# Patient Record
Sex: Female | Born: 1960 | Race: Black or African American | Hispanic: No | Marital: Married | Smoking: Current every day smoker
Health system: Southern US, Community
[De-identification: ages and names within clinical notes are randomized; demographics above are authoritative.]

## PROBLEM LIST (undated history)

## (undated) DIAGNOSIS — I10 Essential (primary) hypertension: Secondary | ICD-10-CM

## (undated) DIAGNOSIS — E119 Type 2 diabetes mellitus without complications: Secondary | ICD-10-CM

## (undated) DIAGNOSIS — G629 Polyneuropathy, unspecified: Secondary | ICD-10-CM

---

## 2016-10-28 ENCOUNTER — Emergency Department
Admission: EM | Admit: 2016-10-28 | Discharge: 2016-10-28 | Disposition: A | Discharge status: Home / Self Care | Payer: Self-pay | Attending: Emergency Medicine | Admitting: Emergency Medicine

## 2016-10-28 ENCOUNTER — Encounter: Payer: Self-pay | Admitting: *Deleted

## 2016-10-28 DIAGNOSIS — I10 Essential (primary) hypertension: Secondary | ICD-10-CM | POA: Insufficient documentation

## 2016-10-28 DIAGNOSIS — Z760 Encounter for issue of repeat prescription: Secondary | ICD-10-CM | POA: Insufficient documentation

## 2016-10-28 DIAGNOSIS — E119 Type 2 diabetes mellitus without complications: Secondary | ICD-10-CM | POA: Insufficient documentation

## 2016-10-28 DIAGNOSIS — Z79899 Other long term (current) drug therapy: Secondary | ICD-10-CM | POA: Insufficient documentation

## 2016-10-28 HISTORY — DX: Type 2 diabetes mellitus without complications: E11.9

## 2016-10-28 HISTORY — DX: Essential (primary) hypertension: I10

## 2016-10-28 MED ORDER — PANTOPRAZOLE SODIUM 20 MG PO TBEC: 20.0000 mg | DELAYED_RELEASE_TABLET | Freq: Every day | ORAL | 0 refills | Status: AC

## 2016-10-28 MED ORDER — POTASSIUM CHLORIDE ER 10 MEQ PO TBCR: 10.0000 meq | EXTENDED_RELEASE_TABLET | Freq: Every day | ORAL | 0 refills | Status: DC

## 2016-10-28 MED ORDER — AMLODIPINE BESYLATE 5 MG PO TABS: 5.0000 mg | ORAL_TABLET | Freq: Every day | ORAL | 0 refills | Status: DC

## 2016-10-28 MED ORDER — TRIAMTERENE-HCTZ 37.5-25 MG PO TABS: 1.0000 | ORAL_TABLET | Freq: Every day | ORAL | 0 refills | Status: AC

## 2016-10-28 NOTE — ED Triage Notes (Signed)
States she is from [place] and is out of her medications (k-dur, HTN meds, and DM meds)

## 2016-10-28 NOTE — ED Provider Notes (Signed)
[facility] Emergency Department Provider Note   ____________________________________________   First MD Initiated Contact with Patient 10/28/16 1025     (approximate)  I have reviewed the triage vital signs and the nursing notes.   HISTORY  Chief Complaint Medication Refill    HPI Molly Daniels is a 55 y.o. female patient requests a refill of medications for her hypertension and reflux. Patient states she recently arrived from [place] and is relocating to this area. Patient asked to have his Primary doctor. Patient denies any pain with her complaint.   Past Medical History:  Diagnosis Date  . Diabetes mellitus without complication (HCC)   . Hypertension     There are no active problems to display for this patient.   History reviewed. No pertinent surgical history.  Prior to Admission medications   Medication Sig Start Date End Date Taking? Authorizing Provider  amLODipine (NORVASC) 5 MG tablet Take 1 tablet (5 mg total) by mouth daily. 10/28/16 10/28/17  Joni Reiningonald K Michell Kader, PA-C  pantoprazole (PROTONIX) 20 MG tablet Take 1 tablet (20 mg total) by mouth daily. 10/28/16   Joni Reiningonald K Inis Borneman, PA-C  potassium chloride (K-DUR) 10 MEQ tablet Take 1 tablet (10 mEq total) by mouth daily. 10/28/16   Joni Reiningonald K Natilie Krabbenhoft, PA-C  triamterene-hydrochlorothiazide (MAXZIDE-25) 37.5-25 MG tablet Take 1 tablet by mouth daily. 10/28/16 10/28/17  Joni Reiningonald K Josef Tourigny, PA-C    Allergies   History reviewed. No pertinent family history.  Social History Social History  Substance Use Topics  . Smoking status: Not on file  . Smokeless tobacco: Not on file  . Alcohol use Not on file    Review of Systems Constitutional: No fever/chills Eyes: No visual changes. ENT: No sore throat. Cardiovascular: Denies chest pain. Respiratory: Denies shortness of breath. Gastrointestinal: No abdominal pain.  No nausea, no vomiting.  No diarrhea.  No constipation. Genitourinary: Negative  for dysuria. Musculoskeletal: Negative for back pain. Skin: Negative for rash. Neurological: Negative for headaches, focal weakness or numbness. Endocrine:Hypertension and diabetes ____________________________________________   PHYSICAL EXAM:  VITAL SIGNS: ED Triage Vitals  Enc Vitals Group     BP 10/28/16 0947 127/88     Pulse Rate 10/28/16 0947 90     Resp 10/28/16 0947 18     Temp 10/28/16 0947 98.1 F (36.7 C)     Temp Source 10/28/16 0947 Oral     SpO2 10/28/16 0947 98 %     Weight 10/28/16 0946 175 lb (79.4 kg)     Height 10/28/16 0946 5\' 4"  (1.626 m)     Head Circumference --      Peak Flow --      Pain Score --      Pain Loc --      Pain Edu? --      Excl. in GC? --     Constitutional: Alert and oriented. Well appearing and in no acute distress. Eyes: Conjunctivae are normal. PERRL. EOMI. Head: Atraumatic. Nose: No congestion/rhinnorhea. Mouth/Throat: Mucous membranes are moist.  Oropharynx non-erythematous. Neck: No stridor.  No cervical spine tenderness to palpation. Hematological/Lymphatic/Immunilogical: No cervical lymphadenopathy. Cardiovascular: Normal rate, regular rhythm. Grossly normal heart sounds.  Good peripheral circulation. Respiratory: Normal respiratory effort.  No retractions. Lungs CTAB. Gastrointestinal: Soft and nontender. No distention. No abdominal bruits. No CVA tenderness. Musculoskeletal: No lower extremity tenderness nor edema.  No joint effusions. Neurologic:  Normal speech and language. No gross focal neurologic deficits are appreciated. No gait instability. Skin:  Skin is  warm, dry and intact. No rash noted. Psychiatric: Mood and affect are normal. Speech and behavior are normal.  ____________________________________________   LABS (all labs ordered are listed, but only abnormal results are displayed)  Labs Reviewed - No data to  display ____________________________________________  EKG   ____________________________________________  RADIOLOGY   ____________________________________________   PROCEDURES  Procedure(s) performed: None  Procedures  Critical Care performed: No  ____________________________________________   INITIAL IMPRESSION / ASSESSMENT AND PLAN / ED COURSE  Pertinent labs & imaging results that were available during my care of the patient were reviewed by me and considered in my medical decision making (see chart for details).  Medication refill for hypertension and GERD. Patient advised to [facility] Patient given a prescription for Maxide, Protonix, and Norvasc.  Clinical Course      ____________________________________________   FINAL CLINICAL IMPRESSION(S) / ED DIAGNOSES  Final diagnoses:  Medication refill      NEW MEDICATIONS STARTED DURING THIS VISIT:  New Prescriptions   AMLODIPINE (NORVASC) 5 MG TABLET    Take 1 tablet (5 mg total) by mouth daily.   PANTOPRAZOLE (PROTONIX) 20 MG TABLET    Take 1 tablet (20 mg total) by mouth daily.   POTASSIUM CHLORIDE (K-DUR) 10 MEQ TABLET    Take 1 tablet (10 mEq total) by mouth daily.   TRIAMTERENE-HYDROCHLOROTHIAZIDE (MAXZIDE-25) 37.5-25 MG TABLET    Take 1 tablet by mouth daily.     Note:  This document was prepared using Dragon voice recognition software and may include unintentional dictation errors.    Joni ReiningRonald K Antwane Grose, PA-C 10/28/16 1028    Governor Rooksebecca Lord, MD 10/28/16 [phone]

## 2016-10-28 NOTE — ED Notes (Addendum)
Pt here for med refill only. States [place] and ran out of meds. Pt denies chest pain, denies sob, denies headache, denies all symptoms.

## 2016-11-12 ENCOUNTER — Encounter: Payer: Self-pay | Admitting: *Deleted

## 2016-11-12 ENCOUNTER — Emergency Department
Admission: EM | Admit: 2016-11-12 | Discharge: 2016-11-12 | Disposition: A | Discharge status: Home / Self Care | Payer: Medicare Other | Attending: Emergency Medicine | Admitting: Emergency Medicine

## 2016-11-12 DIAGNOSIS — B379 Candidiasis, unspecified: Secondary | ICD-10-CM | POA: Diagnosis not present

## 2016-11-12 DIAGNOSIS — I10 Essential (primary) hypertension: Secondary | ICD-10-CM | POA: Diagnosis not present

## 2016-11-12 DIAGNOSIS — L298 Other pruritus: Secondary | ICD-10-CM | POA: Diagnosis present

## 2016-11-12 DIAGNOSIS — E119 Type 2 diabetes mellitus without complications: Secondary | ICD-10-CM | POA: Insufficient documentation

## 2016-11-12 DIAGNOSIS — Z79899 Other long term (current) drug therapy: Secondary | ICD-10-CM | POA: Diagnosis not present

## 2016-11-12 MED ORDER — FLUCONAZOLE 150 MG PO TABS: 150.0000 mg | ORAL_TABLET | Freq: Once | ORAL | 0 refills | Status: AC

## 2016-11-12 NOTE — ED Triage Notes (Signed)
States vaginal lacerations that are so "so deep that they hurt during intercourse", when asked how the lacerations came about pt states "We have been separated for 5 months and I will just leave it at that", pt asking for some type of vaginal cream to help with the lacerations, pt also states sore throat, states abrasion on her left rib cage from her bra strap, husband with pt

## 2016-11-12 NOTE — ED Notes (Signed)
Pt verbalized understanding of d/c instructions, medications and f/u.

## 2016-11-12 NOTE — ED Provider Notes (Signed)
[facility] Emergency Department Provider Note   ____________________________________________    I have reviewed the triage vital signs and the nursing notes.   HISTORY  Chief Complaint vaginal abrasions     HPI Molly Daniels is a 56 y.o. female who presents with complaints of vaginal burning. Patient reports she had intercourse with her significant other several days ago and since then has had burning sensation in her vagina, occasional itching. No other complaints, no dysuria.   Past Medical History:  Diagnosis Date  . Diabetes mellitus without complication (HCC)   . Hypertension     There are no active problems to display for this patient.   History reviewed. No pertinent surgical history.  Prior to Admission medications   Medication Sig Start Date End Date Taking? Authorizing Provider  amLODipine (NORVASC) 5 MG tablet Take 1 tablet (5 mg total) by mouth daily. 10/28/16 10/28/17  Joni Reining, PA-C  fluconazole (DIFLUCAN) 150 MG tablet Take 1 tablet (150 mg total) by mouth once. 11/12/16 11/12/16  Jene Every, MD  pantoprazole (PROTONIX) 20 MG tablet Take 1 tablet (20 mg total) by mouth daily. 10/28/16   Joni Reining, PA-C  potassium chloride (K-DUR) 10 MEQ tablet Take 1 tablet (10 mEq total) by mouth daily. 10/28/16   Joni Reining, PA-C  triamterene-hydrochlorothiazide (MAXZIDE-25) 37.5-25 MG tablet Take 1 tablet by mouth daily. 10/28/16 10/28/17  Joni Reining, PA-C     Allergies Abilify [aripiprazole] and Other  History reviewed. No pertinent family history.  Social History Social History  Substance Use Topics  . Smoking status: Not on file  . Smokeless tobacco: Not on file  . Alcohol use Not on file    Review of Systems  Constitutional: No fever/chills  ENT: No sore throat.   Gastrointestinal: No abdominal pain.  No nausea, no vomiting.   Genitourinary: Negative for dysuria.As above  Skin: Negative for  rash.     ____________________________________________   PHYSICAL EXAM:  VITAL SIGNS: ED Triage Vitals [11/12/16 1330]  Enc Vitals Group     BP 139/86     Pulse Rate 79     Resp 18     Temp 98.1 F (36.7 C)     Temp Source Oral     SpO2 100 %     Weight 175 lb (79.4 kg)     Height 5\' 4"  (1.626 m)     Head Circumference      Peak Flow      Pain Score 0     Pain Loc      Pain Edu?      Excl. in GC?     Constitutional: Alert and oriented. No acute distress.  Eyes: Conjunctivae are normal.  Head: Atraumatic. Nose: No congestion/rhinnorhea. Mouth/Throat: Mucous membranes are moist.   Cardiovascular: Normal rate, regular rhythm.  Respiratory: Normal respiratory effort.  No retractions. Genitourinary: Exam consistent with yeast infection Musculoskeletal: No lower extremity tenderness nor edema.   Neurologic:  Normal speech and language.  Skin:  Skin is warm, dry and intact. No rash noted.   ____________________________________________   LABS (all labs ordered are listed, but only abnormal results are displayed)  Labs Reviewed - No data to display ____________________________________________  EKG   ____________________________________________  RADIOLOGY  None ____________________________________________   PROCEDURES  Procedure(s) performed: No    Critical Care performed: No ____________________________________________   INITIAL IMPRESSION / ASSESSMENT AND PLAN / ED COURSE  Pertinent labs & imaging results that  were available during my care of the patient were reviewed by me and considered in my medical decision making (see chart for details).  Exam consistent with yeast infection, likely Candida we'll treat with fluconazole outpatient follow-up as needed   ____________________________________________   FINAL CLINICAL IMPRESSION(S) / ED DIAGNOSES  Final diagnoses:  Yeast infection      NEW MEDICATIONS STARTED DURING THIS  VISIT:  Discharge Medication List as of 11/12/2016  3:09 PM    START taking these medications   Details  fluconazole (DIFLUCAN) 150 MG tablet Take 1 tablet (150 mg total) by mouth once., Starting Wed 11/12/2016, Print         Note:  This document was prepared using Dragon voice recognition software and may include unintentional dictation errors.    Jene Everyobert Johnryan Sao, MD 11/12/16 2227

## 2016-11-12 NOTE — ED Notes (Signed)
Pt requesting that significant other be informed of findings. Pt reports she feels safe at home and with significant other, denies needing any assistance.

## 2016-11-12 NOTE — ED Notes (Signed)
Pt reports painful to have sex and is concerned of an infection or laceration. MD in room to assess pt. Pt significant other speaking for pt and asked to step out of the room for examination.

## 2016-11-19 ENCOUNTER — Ambulatory Visit: Payer: Medicare Other

## 2016-11-29 ENCOUNTER — Emergency Department
Admission: EM | Admit: 2016-11-29 | Discharge: 2016-11-29 | Disposition: A | Discharge status: Home / Self Care | Payer: Medicare Other | Attending: Emergency Medicine | Admitting: Emergency Medicine

## 2016-11-29 ENCOUNTER — Encounter: Payer: Self-pay | Admitting: Emergency Medicine

## 2016-11-29 DIAGNOSIS — I10 Essential (primary) hypertension: Secondary | ICD-10-CM | POA: Diagnosis not present

## 2016-11-29 DIAGNOSIS — R350 Frequency of micturition: Secondary | ICD-10-CM | POA: Insufficient documentation

## 2016-11-29 DIAGNOSIS — E119 Type 2 diabetes mellitus without complications: Secondary | ICD-10-CM | POA: Diagnosis not present

## 2016-11-29 DIAGNOSIS — Z760 Encounter for issue of repeat prescription: Secondary | ICD-10-CM | POA: Diagnosis not present

## 2016-11-29 DIAGNOSIS — F17200 Nicotine dependence, unspecified, uncomplicated: Secondary | ICD-10-CM | POA: Insufficient documentation

## 2016-11-29 DIAGNOSIS — Z79899 Other long term (current) drug therapy: Secondary | ICD-10-CM | POA: Insufficient documentation

## 2016-11-29 HISTORY — DX: Polyneuropathy, unspecified: G62.9

## 2016-11-29 LAB — URINALYSIS, COMPLETE (UACMP) WITH MICROSCOPIC
BACTERIA UA: NONE SEEN
BILIRUBIN URINE: NEGATIVE
Glucose, UA: NEGATIVE mg/dL
HGB URINE DIPSTICK: NEGATIVE
KETONES UR: NEGATIVE mg/dL
LEUKOCYTES UA: NEGATIVE
NITRITE: NEGATIVE
PH: 5 (ref 5.0–8.0)
Protein, ur: NEGATIVE mg/dL
Specific Gravity, Urine: 1.012 (ref 1.005–1.030)

## 2016-11-29 MED ORDER — PHENAZOPYRIDINE HCL 200 MG PO TABS: 200.0000 mg | ORAL_TABLET | Freq: Three times a day (TID) | ORAL | 0 refills | Status: AC | PRN

## 2016-11-29 MED ORDER — PHENAZOPYRIDINE HCL 200 MG PO TABS
200.0000 mg | ORAL_TABLET | Freq: Once | ORAL | Status: AC
  Administered 2016-11-29: 200 mg via ORAL
  Filled 2016-11-29: qty 1

## 2016-11-29 MED ORDER — POTASSIUM CHLORIDE ER 10 MEQ PO TBCR: 10.0000 meq | EXTENDED_RELEASE_TABLET | Freq: Every day | ORAL | 1 refills | Status: DC

## 2016-11-29 MED ORDER — TRIAMTERENE-HCTZ 37.5-25 MG PO TABS: 1.0000 | ORAL_TABLET | Freq: Every day | ORAL | 1 refills | Status: AC

## 2016-11-29 MED ORDER — AMLODIPINE BESYLATE 5 MG PO TABS: 5.0000 mg | ORAL_TABLET | Freq: Every day | ORAL | 1 refills | Status: DC

## 2016-11-29 NOTE — Discharge Instructions (Signed)
Your medication refill for 1 month. Before they run out he must establish care with family doctor.

## 2016-11-29 NOTE — ED Notes (Signed)
Ron PA at bedside. Pt's initial complaint is urinary frequency. Pt also states she fell 1 week ago and is out of her medications.

## 2016-11-29 NOTE — ED Triage Notes (Signed)
Pt in via POV with complaints of urinary frequency and urgency x approximately one week.  Pt denies any dysuria, denies any abdominal pain, denies N/VD.  NAD noted at this time.

## 2016-11-29 NOTE — ED Provider Notes (Signed)
[facility] Emergency Department Provider Note   ____________________________________________   First MD Initiated Contact with Patient 11/29/16 1547     (approximate)  I have reviewed the triage vital signs and the nursing notes.   HISTORY  Chief Complaint Urinary Frequency    HPI Molly Daniels is a 56 y.o. female patient presented to the emergency room for urinary frequency and urgency for 1 week. Patient denies any dysuria. Patient denies vaginal discharge, pelvic pain flank pain or fever. Patient also requests refill her medications. Patient was seen here last year for same complaint and had her medications filled but did not establish care with family doctor. Patient denies any pain in her urgency or frequency. Patient also complaining of knee pain status post a fall one week ago. Patient states she is able to bear weight without discomfort.   Past Medical History:  Diagnosis Date  . Diabetes mellitus without complication (HCC)   . Hypertension   . Neuropathy (HCC)     There are no active problems to display for this patient.   History reviewed. No pertinent surgical history.  Prior to Admission medications   Medication Sig Start Date End Date Taking? Authorizing Provider  amLODipine (NORVASC) 5 MG tablet Take 1 tablet (5 mg total) by mouth daily. 10/28/16 10/28/17  Joni Reiningonald K Koriana Stepien, PA-C  amLODipine (NORVASC) 5 MG tablet Take 1 tablet (5 mg total) by mouth daily. 11/29/16 11/29/17  Joni Reiningonald K Nyshaun Standage, PA-C  pantoprazole (PROTONIX) 20 MG tablet Take 1 tablet (20 mg total) by mouth daily. 10/28/16   Joni Reiningonald K Danaija Eskridge, PA-C  phenazopyridine (PYRIDIUM) 200 MG tablet Take 1 tablet (200 mg total) by mouth 3 (three) times daily as needed for pain. 11/29/16 11/29/17  Joni Reiningonald K Saqib Cazarez, PA-C  potassium chloride (K-DUR) 10 MEQ tablet Take 1 tablet (10 mEq total) by mouth daily. 10/28/16   Joni Reiningonald K Dondrea Clendenin, PA-C  potassium chloride (K-DUR) 10 MEQ tablet Take 1 tablet  (10 mEq total) by mouth daily. 11/29/16   Joni Reiningonald K Abijah Roussel, PA-C  triamterene-hydrochlorothiazide (MAXZIDE-25) 37.5-25 MG tablet Take 1 tablet by mouth daily. 10/28/16 10/28/17  Joni Reiningonald K Edelin Fryer, PA-C  triamterene-hydrochlorothiazide (MAXZIDE-25) 37.5-25 MG tablet Take 1 tablet by mouth daily. 11/29/16 11/29/17  Joni Reiningonald K Burdette Gergely, PA-C    Allergies Abilify [aripiprazole] and Other  No family history on file.  Social History Social History  Substance Use Topics  . Smoking status: Current Every Day Smoker    Packs/day: 0.25  . Smokeless tobacco: Never Used  . Alcohol use 1.2 oz/week    2 Glasses of wine per week    Review of Systems Constitutional: No fever/chills Eyes: No visual changes. ENT: No sore throat. Cardiovascular: Denies chest pain. Respiratory: Denies shortness of breath. Gastrointestinal: No abdominal pain.  No nausea, no vomiting.  No diarrhea.  No constipation. Genitourinary: Negative for dysuria. Positive for urgency and frequency. Musculoskeletal: Negative for back pain. Skin: Negative for rash. Neurological: Negative for headaches, focal weakness or numbness. Endocrine:Hypertension and diabetes. Hematological/Lymphatic: Allergic/Immunilogical: See medication list.   ____________________________________________   PHYSICAL EXAM:  VITAL SIGNS: ED Triage Vitals  Enc Vitals Group     BP 11/29/16 1440 (!) 164/104     Pulse Rate 11/29/16 1440 88     Resp 11/29/16 1440 18     Temp 11/29/16 1440 98.9 F (37.2 C)     Temp Source 11/29/16 1440 Oral     SpO2 11/29/16 1440 100 %     Weight 11/29/16 1442  170 lb (77.1 kg)     Height 11/29/16 1442 5\' 4"  (1.626 m)     Head Circumference --      Peak Flow --      Pain Score 11/29/16 1554 0     Pain Loc --      Pain Edu? --      Excl. in GC? --     Constitutional: Alert and oriented. Well appearing and in no acute distress. Eyes: Conjunctivae are normal. PERRL. EOMI. Head: Atraumatic. Nose: No  congestion/rhinnorhea. Mouth/Throat: Mucous membranes are moist.  Oropharynx non-erythematous. Neck: No stridor.  No cervical spine tenderness to palpation. Hematological/Lymphatic/Immunilogical: No cervical lymphadenopathy. Cardiovascular: Normal rate, regular rhythm. Grossly normal heart sounds.  Good peripheral circulation. Blood pressure Respiratory: Normal respiratory effort.  No retractions. Lungs CTAB. Gastrointestinal: Soft and nontender. No distention. No abdominal bruits. No CVA tenderness. Musculoskeletal: No lower extremity tenderness nor edema.  No joint effusions. Neurologic:  Normal speech and language. No gross focal neurologic deficits are appreciated. No gait instability. Skin:  Skin is warm, dry and intact. No rash noted. Psychiatric: Mood and affect are normal. Speech and behavior are normal.  ____________________________________________   LABS (all labs ordered are listed, but only abnormal results are displayed)  Labs Reviewed  URINALYSIS, COMPLETE (UACMP) WITH MICROSCOPIC - Abnormal; Notable for the following:       Result Value   Color, Urine YELLOW (*)    APPearance CLEAR (*)    Squamous Epithelial / LPF 0-5 (*)    All other components within normal limits   ____________________________________________  EKG   ____________________________________________  RADIOLOGY   ____________________________________________   PROCEDURES  Procedure(s) performed: None  Procedures  Critical Care performed: No  ____________________________________________   INITIAL IMPRESSION / ASSESSMENT AND PLAN / ED COURSE  Pertinent labs & imaging results that were available during my care of the patient were reviewed by me and considered in my medical decision making (see chart for details).  Urinary frequency and medication refill. Advised patient to ER cannot continue to fill her medications for chronic medical complaints. Advised to follow-up with family clinic to  establish care.      ____________________________________________   FINAL CLINICAL IMPRESSION(S) / ED DIAGNOSES  Final diagnoses:  Urinary frequency  Encounter for medication refill      NEW MEDICATIONS STARTED DURING THIS VISIT:  New Prescriptions   AMLODIPINE (NORVASC) 5 MG TABLET    Take 1 tablet (5 mg total) by mouth daily.   PHENAZOPYRIDINE (PYRIDIUM) 200 MG TABLET    Take 1 tablet (200 mg total) by mouth 3 (three) times daily as needed for pain.   POTASSIUM CHLORIDE (K-DUR) 10 MEQ TABLET    Take 1 tablet (10 mEq total) by mouth daily.   TRIAMTERENE-HYDROCHLOROTHIAZIDE (MAXZIDE-25) 37.5-25 MG TABLET    Take 1 tablet by mouth daily.     Note:  This document was prepared using Dragon voice recognition software and may include unintentional dictation errors.    Joni Reining, PA-C 11/29/16 1619    Joni Reining, PA-C 11/29/16 1619    Minna Antis, MD 11/30/16 1355

## 2016-12-03 ENCOUNTER — Ambulatory Visit: Payer: Medicare Other

## 2016-12-17 ENCOUNTER — Ambulatory Visit: Payer: Medicare Other | Attending: Oncology

## 2016-12-30 ENCOUNTER — Other Ambulatory Visit: Payer: Self-pay | Admitting: Internal Medicine

## 2016-12-31 ENCOUNTER — Other Ambulatory Visit: Payer: Self-pay | Admitting: Internal Medicine

## 2016-12-31 DIAGNOSIS — Z1231 Encounter for screening mammogram for malignant neoplasm of breast: Secondary | ICD-10-CM

## 2017-01-02 ENCOUNTER — Ambulatory Visit: Payer: Medicare Other | Attending: Internal Medicine

## 2017-01-23 ENCOUNTER — Emergency Department
Admission: EM | Admit: 2017-01-23 | Discharge: 2017-01-23 | Disposition: A | Discharge status: Home / Self Care | Payer: Medicare Other | Attending: Emergency Medicine | Admitting: Emergency Medicine

## 2017-01-23 ENCOUNTER — Ambulatory Visit: Payer: Medicare Other

## 2017-01-23 ENCOUNTER — Encounter: Payer: Self-pay | Admitting: Emergency Medicine

## 2017-01-23 DIAGNOSIS — N898 Other specified noninflammatory disorders of vagina: Secondary | ICD-10-CM | POA: Diagnosis present

## 2017-01-23 DIAGNOSIS — E119 Type 2 diabetes mellitus without complications: Secondary | ICD-10-CM | POA: Diagnosis not present

## 2017-01-23 DIAGNOSIS — Z79899 Other long term (current) drug therapy: Secondary | ICD-10-CM | POA: Insufficient documentation

## 2017-01-23 DIAGNOSIS — F17200 Nicotine dependence, unspecified, uncomplicated: Secondary | ICD-10-CM | POA: Diagnosis not present

## 2017-01-23 DIAGNOSIS — I10 Essential (primary) hypertension: Secondary | ICD-10-CM | POA: Diagnosis not present

## 2017-01-23 LAB — URINALYSIS, COMPLETE (UACMP) WITH MICROSCOPIC
BACTERIA UA: NONE SEEN
BILIRUBIN URINE: NEGATIVE
Glucose, UA: NEGATIVE mg/dL
HGB URINE DIPSTICK: NEGATIVE
Ketones, ur: NEGATIVE mg/dL
LEUKOCYTES UA: NEGATIVE
NITRITE: NEGATIVE
PROTEIN: NEGATIVE mg/dL
Specific Gravity, Urine: 1.01 (ref 1.005–1.030)
pH: 6 (ref 5.0–8.0)

## 2017-01-23 LAB — WET PREP, GENITAL
Clue Cells Wet Prep HPF POC: NONE SEEN
SPERM: NONE SEEN
Trich, Wet Prep: NONE SEEN
WBC, Wet Prep HPF POC: NONE SEEN
Yeast Wet Prep HPF POC: NONE SEEN

## 2017-01-23 LAB — CHLAMYDIA/NGC RT PCR (ARMC ONLY)
CHLAMYDIA TR: NOT DETECTED
N gonorrhoeae: NOT DETECTED

## 2017-01-23 NOTE — ED Triage Notes (Signed)
Pt to ED via POV for c/o vaginal discharge x 3 weeks. Patient states that she is also having vaginal itching. Pt denies any odor. Pt has not used any OTC medications. Pt in NAD in triage.

## 2017-01-23 NOTE — ED Provider Notes (Signed)
[facility] Emergency Department Provider Note  ____________________________________________   First MD Initiated Contact with Patient 01/23/17 1108     (approximate)  I have reviewed the triage vital signs and the nursing notes.   HISTORY  Chief Complaint Vaginal Discharge    HPI Molly Daniels is a 56 y.o. female is here complaining of vaginal discharge for 3 weeks. Patient states that she is also has some vaginal itching but denies any odor. She has describes this as a small amount of white material without odor.   The patient has not been using any over-the-counter medication prior to this visit. She has not seen her PCP for this problem. Patient continues to take her regular medications however she states she did not take her blood pressure medication today. When asked if she had been exposed to an STI patient answers no however she seems to be very concerned about chlamydia. She denies any pain at this time.   Past Medical History:  Diagnosis Date  . Diabetes mellitus without complication (HCC)   . Hypertension   . Neuropathy (HCC)     There are no active problems to display for this patient.   History reviewed. No pertinent surgical history.  Prior to Admission medications   Medication Sig Start Date End Date Taking? Authorizing Provider  amLODipine (NORVASC) 5 MG tablet Take 1 tablet (5 mg total) by mouth daily. 10/28/16 10/28/17  Joni Reiningonald K Smith, PA-C  amLODipine (NORVASC) 5 MG tablet Take 1 tablet (5 mg total) by mouth daily. 11/29/16 11/29/17  Joni Reiningonald K Smith, PA-C  pantoprazole (PROTONIX) 20 MG tablet Take 1 tablet (20 mg total) by mouth daily. 10/28/16   Joni Reiningonald K Smith, PA-C  phenazopyridine (PYRIDIUM) 200 MG tablet Take 1 tablet (200 mg total) by mouth 3 (three) times daily as needed for pain. 11/29/16 11/29/17  Joni Reiningonald K Smith, PA-C  potassium chloride (K-DUR) 10 MEQ tablet Take 1 tablet (10 mEq total) by mouth daily. 10/28/16   Joni Reiningonald K  Smith, PA-C  potassium chloride (K-DUR) 10 MEQ tablet Take 1 tablet (10 mEq total) by mouth daily. 11/29/16   Joni Reiningonald K Smith, PA-C  triamterene-hydrochlorothiazide (MAXZIDE-25) 37.5-25 MG tablet Take 1 tablet by mouth daily. 10/28/16 10/28/17  Joni Reiningonald K Smith, PA-C  triamterene-hydrochlorothiazide (MAXZIDE-25) 37.5-25 MG tablet Take 1 tablet by mouth daily. 11/29/16 11/29/17  Joni Reiningonald K Smith, PA-C    Allergies Abilify [aripiprazole] and Other  No family history on file.  Social History Social History  Substance Use Topics  . Smoking status: Current Every Day Smoker    Packs/day: 0.25  . Smokeless tobacco: Never Used  . Alcohol use 1.2 oz/week    2 Glasses of wine per week    Review of Systems Constitutional: No fever/chills Cardiovascular: Denies chest pain. Respiratory: Denies shortness of breath. Gastrointestinal:   No nausea, no vomiting.   Musculoskeletal: Negative for back pain. Skin: Negative for rash. Neurological: Negative for headaches  10-point ROS otherwise negative.  ____________________________________________   PHYSICAL EXAM:  VITAL SIGNS: ED Triage Vitals  Enc Vitals Group     BP 01/23/17 1044 (!) 162/95     Pulse Rate 01/23/17 1044 85     Resp 01/23/17 1044 17     Temp 01/23/17 1044 98 F (36.7 C)     Temp Source 01/23/17 1044 Oral     SpO2 01/23/17 1044 100 %     Weight 01/23/17 1045 163 lb (73.9 kg)     Height 01/23/17 1045 5'  4" (1.626 m)     Head Circumference --      Peak Flow --      Pain Score 01/23/17 1118 0     Pain Loc --      Pain Edu? --      Excl. in GC? --     Constitutional: Alert and oriented. Well appearing and in no acute distress. Eyes: Conjunctivae are normal. PERRL. EOMI. Head: Atraumatic. Nose: No congestion/rhinnorhea. Neck: No stridor.   Cardiovascular: Normal rate, regular rhythm. Grossly normal heart sounds.  Good peripheral circulation. Respiratory: Normal respiratory effort.  No retractions. Lungs  CTAB. Gastrointestinal: Soft and nontender. No distention.  Genitourinary: No exudate or discharge was noted on vaginal exam. There is no adnexal masses or tenderness noted. Cultures were obtained for chlamydia and gonorrhea. Also wet prep was sent to the lab. Musculoskeletal: No lower extremity tenderness nor edema.  Neurologic:  Normal speech and language. No gross focal neurologic deficits are appreciated. No gait instability. Skin:  Skin is warm, dry and intact. No rash noted. Psychiatric: Mood and affect are normal. Speech and behavior are normal.  ____________________________________________   LABS (all labs ordered are listed, but only abnormal results are displayed)  Labs Reviewed  URINALYSIS, COMPLETE (UACMP) WITH MICROSCOPIC - Abnormal; Notable for the following:       Result Value   Color, Urine YELLOW (*)    APPearance CLEAR (*)    Squamous Epithelial / LPF 0-5 (*)    All other components within normal limits  CHLAMYDIA/NGC RT PCR ([facility] ONLY)  WET PREP, GENITAL   PROCEDURES  Procedure(s) performed: None  Procedures  Critical Care performed: No  ____________________________________________   INITIAL IMPRESSION / ASSESSMENT AND PLAN / ED COURSE  Pertinent labs & imaging results that were available during my care of the patient were reviewed by me and considered in my medical decision making (see chart for details).  At the time of discharge chlamydia and gonorrhea test was still pending. Patient was made aware of her wet prep which did not show any bacteria, yeast, or Trichomonas. No prescriptions were written and patient was made aware that she would be called if her gonorrhea or chlamydia tests come back positive. Patient is encouraged to take her blood pressure medication every day. She is follow-up with her PCP Dr. Dareen Piano if any continued problems.       ____________________________________________   FINAL CLINICAL IMPRESSION(S) / ED  DIAGNOSES  Final diagnoses:  Vaginal discharge  Essential hypertension      NEW MEDICATIONS STARTED DURING THIS VISIT:  Discharge Medication List as of 01/23/2017 12:32 PM       Note:  This document was prepared using Dragon voice recognition software and may include unintentional dictation errors.    Tommi Rumps, PA-C 01/23/17 1543    Myrna Blazer, MD 01/23/17 [phone]

## 2017-01-23 NOTE — Discharge Instructions (Signed)
Follow-up with your  primary care doctor, Dr. Dareen PianoAnderson for your blood pressure. Today's blood pressure in the emergency department was elevated. Take your blood pressure medication when you get home. Wet prep today did not show any bacteria, yeast or Trichomonas.  You will be called for the results of your culture if they are positive.

## 2017-02-06 ENCOUNTER — Emergency Department
Admission: EM | Admit: 2017-02-06 | Discharge: 2017-02-06 | Disposition: A | Discharge status: Home / Self Care | Payer: Medicare Other | Attending: Emergency Medicine | Admitting: Emergency Medicine

## 2017-02-06 ENCOUNTER — Encounter: Payer: Self-pay | Admitting: Emergency Medicine

## 2017-02-06 DIAGNOSIS — F250 Schizoaffective disorder, bipolar type: Secondary | ICD-10-CM

## 2017-02-06 DIAGNOSIS — E119 Type 2 diabetes mellitus without complications: Secondary | ICD-10-CM | POA: Diagnosis not present

## 2017-02-06 DIAGNOSIS — R443 Hallucinations, unspecified: Secondary | ICD-10-CM | POA: Insufficient documentation

## 2017-02-06 DIAGNOSIS — I10 Essential (primary) hypertension: Secondary | ICD-10-CM | POA: Insufficient documentation

## 2017-02-06 DIAGNOSIS — Z046 Encounter for general psychiatric examination, requested by authority: Secondary | ICD-10-CM | POA: Diagnosis present

## 2017-02-06 DIAGNOSIS — F17200 Nicotine dependence, unspecified, uncomplicated: Secondary | ICD-10-CM | POA: Insufficient documentation

## 2017-02-06 DIAGNOSIS — F259 Schizoaffective disorder, unspecified: Secondary | ICD-10-CM

## 2017-02-06 LAB — CBC
HEMATOCRIT: 41.4 % (ref 35.0–47.0)
Hemoglobin: 14.1 g/dL (ref 12.0–16.0)
MCH: 28.7 pg (ref 26.0–34.0)
MCHC: 34.1 g/dL (ref 32.0–36.0)
MCV: 84.2 fL (ref 80.0–100.0)
PLATELETS: 340 10*3/uL (ref 150–440)
RBC: 4.91 MIL/uL (ref 3.80–5.20)
RDW: 13 % (ref 11.5–14.5)
WBC: 7.2 10*3/uL (ref 3.6–11.0)

## 2017-02-06 LAB — COMPREHENSIVE METABOLIC PANEL
ALK PHOS: 88 U/L (ref 38–126)
ALT: 15 U/L (ref 14–54)
AST: 17 U/L (ref 15–41)
Albumin: 4.6 g/dL (ref 3.5–5.0)
Anion gap: 9 (ref 5–15)
BILIRUBIN TOTAL: 0.4 mg/dL (ref 0.3–1.2)
BUN: 14 mg/dL (ref 6–20)
CALCIUM: 9.6 mg/dL (ref 8.9–10.3)
CO2: 29 mmol/L (ref 22–32)
CREATININE: 0.88 mg/dL (ref 0.44–1.00)
Chloride: 98 mmol/L — ABNORMAL LOW (ref 101–111)
GFR calc Af Amer: >60 mL/min (ref >60)
Glucose, Bld: 169 mg/dL — ABNORMAL HIGH (ref 65–99)
POTASSIUM: 3.1 mmol/L — AB (ref 3.5–5.1)
Sodium: 136 mmol/L (ref 135–145)
TOTAL PROTEIN: 7.7 g/dL (ref 6.5–8.1)

## 2017-02-06 LAB — ACETAMINOPHEN LEVEL: Acetaminophen (Tylenol), Serum: <10 ug/mL — ABNORMAL LOW (ref 10–30)

## 2017-02-06 LAB — SALICYLATE LEVEL: Salicylate Lvl: <7 mg/dL (ref 2.8–30.0)

## 2017-02-06 LAB — ETHANOL

## 2017-02-06 MED ORDER — OLANZAPINE-FLUOXETINE HCL 12-25 MG PO CAPS: 1.0000 | ORAL_CAPSULE | Freq: Every evening | ORAL | 1 refills | Status: DC
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
## 2017-02-06 NOTE — ED Provider Notes (Signed)
[facility] Emergency Department Provider Note  ____________________________________________   First MD Initiated Contact with Patient 02/06/17 1605     (approximate)  I have reviewed the triage vital signs and the nursing notes.   HISTORY  Chief Complaint Psychiatric Evaluation   HPI Molly Daniels is a 56 y.o. female with a history of diabetes as well as psychiatric disease on the today who is presenting to the emergency department with auditory hallucinations over the past 4 days. She says that she believes she would need a higher dose of this medication. She is denying any suicidal or homicidal ideation. No command hallucinations. Patient says that the voices told the people are watching her and are not happy with her current life problems.   Past Medical History:  Diagnosis Date  . Diabetes mellitus without complication (HCC)   . Hypertension   . Neuropathy (HCC)     There are no active problems to display for this patient.   History reviewed. No pertinent surgical history.  Prior to Admission medications   Medication Sig Start Date End Date Taking? Authorizing Provider  amLODipine (NORVASC) 5 MG tablet Take 1 tablet (5 mg total) by mouth daily. 10/28/16 10/28/17  Joni Reining, PA-C  amLODipine (NORVASC) 5 MG tablet Take 1 tablet (5 mg total) by mouth daily. 11/29/16 11/29/17  Joni Reining, PA-C  OLANZapine-FLUoxetine (SYMBYAX) 12-25 MG capsule Take 1 capsule by mouth every evening. 02/06/17   Audery Amel, MD  pantoprazole (PROTONIX) 20 MG tablet Take 1 tablet (20 mg total) by mouth daily. 10/28/16   Joni Reining, PA-C  phenazopyridine (PYRIDIUM) 200 MG tablet Take 1 tablet (200 mg total) by mouth 3 (three) times daily as needed for pain. 11/29/16 11/29/17  Joni Reining, PA-C  potassium chloride (K-DUR) 10 MEQ tablet Take 1 tablet (10 mEq total) by mouth daily. 10/28/16   Joni Reining, PA-C  potassium chloride (K-DUR) 10 MEQ tablet  Take 1 tablet (10 mEq total) by mouth daily. 11/29/16   Joni Reining, PA-C  triamterene-hydrochlorothiazide (MAXZIDE-25) 37.5-25 MG tablet Take 1 tablet by mouth daily. 10/28/16 10/28/17  Joni Reining, PA-C  triamterene-hydrochlorothiazide (MAXZIDE-25) 37.5-25 MG tablet Take 1 tablet by mouth daily. 11/29/16 11/29/17  Joni Reining, PA-C    Allergies Abilify [aripiprazole] and Other  No family history on file.  Social History Social History  Substance Use Topics  . Smoking status: Current Every Day Smoker    Packs/day: 0.25  . Smokeless tobacco: Never Used  . Alcohol use 1.2 oz/week    2 Glasses of wine per week    Review of Systems Constitutional: No fever/chills Eyes: No visual changes. ENT: No sore throat. Cardiovascular: Denies chest pain. Respiratory: Denies shortness of breath. Gastrointestinal: No abdominal pain.  No nausea, no vomiting.  No diarrhea.  No constipation. Genitourinary: Negative for dysuria. Musculoskeletal: Negative for back pain. Skin: Negative for rash. Neurological: Negative for headaches, focal weakness or numbness.  10-point ROS otherwise negative.  ____________________________________________   PHYSICAL EXAM:  VITAL SIGNS: ED Triage Vitals [02/06/17 1534]  Enc Vitals Group     BP (!) 143/90     Pulse Rate 91     Resp 16     Temp 99.5 F (37.5 C)     Temp Source Oral     SpO2 100 %     Weight 163 lb (73.9 kg)     [facility]      [facility]  Peak Flow      Pain Score      Pain Loc      Pain Edu?      Excl. in GC?     Constitutional: Alert and oriented. Well appearing and in no acute distress. Eyes: Conjunctivae are normal. PERRL. EOMI. Head: Atraumatic. Nose: No congestion/rhinnorhea. Mouth/Throat: Mucous membranes are moist.  Neck: No stridor.   Cardiovascular: Normal rate, regular rhythm. Grossly normal heart sounds.   Respiratory: Normal respiratory effort.  No retractions. Lungs CTAB. Gastrointestinal: Soft and  nontender. No distention.  Musculoskeletal: No lower extremity tenderness nor edema.  No joint effusions. Neurologic:  Normal speech and language. No gross focal neurologic deficits are appreciated.  Skin:  Skin is warm, dry and intact. No rash noted. Psychiatric: Mood and affect are normal. Speech and behavior are normal.  ____________________________________________   LABS (all labs ordered are listed, but only abnormal results are displayed)  Labs Reviewed  COMPREHENSIVE METABOLIC PANEL - Abnormal; Notable for the following:       Result Value   Potassium 3.1 (*)    Chloride 98 (*)    Glucose, Bld 169 (*)    All other components within normal limits  ACETAMINOPHEN LEVEL - Abnormal; Notable for the following:    Acetaminophen (Tylenol), Serum <10 (*)    All other components within normal limits  ETHANOL  SALICYLATE LEVEL  CBC  URINE DRUG SCREEN, QUALITATIVE ([facility] ONLY)   ____________________________________________  EKG   ____________________________________________  RADIOLOGY   ____________________________________________   PROCEDURES  Procedure(s) performed:   Procedures  Critical Care performed:   ____________________________________________   INITIAL IMPRESSION / ASSESSMENT AND PLAN / ED COURSE  Pertinent labs & imaging results that were available during my care of the patient were reviewed by me and considered in my medical decision making (see chart for details).  Psychiatry to see. I do not believe that the patient requires involuntary commitment at this time.    ----------------------------------------- 6:00 PM on 02/06/2017 -----------------------------------------   Patient seen and evaluated by Dr. Toni Amend will be changing her Latuda to Symbyax.  Patient to follow-up with [facility]. She is calm and cooperative at this time. She'll be discharged home.  ____________________________________________   FINAL CLINICAL IMPRESSION(S) / ED  DIAGNOSES  Hallucinations    NEW MEDICATIONS STARTED DURING THIS VISIT:  Current Discharge Medication List    START taking these medications   Details  OLANZapine-FLUoxetine (SYMBYAX) 12-25 MG capsule Take 1 capsule by mouth every evening. Qty: 30 capsule, Refills: 1         Note:  This document was prepared using Dragon voice recognition software and may include unintentional dictation errors.    Myrna Blazer, MD 02/06/17 [phone]

## 2017-02-06 NOTE — ED Notes (Signed)
Pt verbalized understanding of discharge instructions. NAD at this time. 

## 2017-02-06 NOTE — ED Notes (Signed)
Pt dressed out by this RN and [place], Environmental health practitioner.  Pt clothing, shoes, and purse placed in pt belongings bag. 3 rings, necklace, and ear rings put in specimen cup and labeled, cup taped shut and placed in pt belongings bag.

## 2017-02-10 ENCOUNTER — Ambulatory Visit
Admission: RE | Admit: 2017-02-10 | Discharge: 2017-02-10 | Disposition: A | Discharge status: Home / Self Care | Payer: Medicare Other | Source: Ambulatory Visit | Attending: Internal Medicine | Admitting: Internal Medicine

## 2017-02-10 ENCOUNTER — Encounter: Payer: Self-pay | Admitting: Radiology

## 2017-02-10 DIAGNOSIS — R928 Other abnormal and inconclusive findings on diagnostic imaging of breast: Secondary | ICD-10-CM | POA: Insufficient documentation

## 2017-02-10 DIAGNOSIS — Z1231 Encounter for screening mammogram for malignant neoplasm of breast: Secondary | ICD-10-CM

## 2017-02-20 ENCOUNTER — Other Ambulatory Visit: Payer: Self-pay | Admitting: Internal Medicine

## 2017-02-20 DIAGNOSIS — N6489 Other specified disorders of breast: Secondary | ICD-10-CM

## 2017-02-20 DIAGNOSIS — N6310 Unspecified lump in the right breast, unspecified quadrant: Secondary | ICD-10-CM

## 2017-02-20 DIAGNOSIS — R928 Other abnormal and inconclusive findings on diagnostic imaging of breast: Secondary | ICD-10-CM

## 2017-02-26 ENCOUNTER — Ambulatory Visit
Admission: RE | Admit: 2017-02-26 | Discharge: 2017-02-26 | Disposition: A | Discharge status: Home / Self Care | Payer: Medicare Other | Source: Ambulatory Visit | Attending: Internal Medicine | Admitting: Internal Medicine

## 2017-02-26 DIAGNOSIS — N6310 Unspecified lump in the right breast, unspecified quadrant: Secondary | ICD-10-CM | POA: Insufficient documentation

## 2017-02-26 DIAGNOSIS — N6489 Other specified disorders of breast: Secondary | ICD-10-CM

## 2017-02-26 DIAGNOSIS — R928 Other abnormal and inconclusive findings on diagnostic imaging of breast: Secondary | ICD-10-CM

## 2017-03-02 ENCOUNTER — Other Ambulatory Visit: Payer: Self-pay | Admitting: Internal Medicine

## 2017-03-02 DIAGNOSIS — N6310 Unspecified lump in the right breast, unspecified quadrant: Secondary | ICD-10-CM

## 2017-03-02 DIAGNOSIS — R928 Other abnormal and inconclusive findings on diagnostic imaging of breast: Secondary | ICD-10-CM

## 2017-03-11 ENCOUNTER — Ambulatory Visit
Admission: RE | Admit: 2017-03-11 | Discharge: 2017-03-11 | Disposition: A | Discharge status: Home / Self Care | Payer: Medicare Other | Source: Ambulatory Visit | Attending: Internal Medicine | Admitting: Internal Medicine

## 2017-03-16 ENCOUNTER — Emergency Department
Admission: EM | Admit: 2017-03-16 | Discharge: 2017-03-16 | Disposition: A | Discharge status: Home / Self Care | Payer: Medicare Other | Attending: Emergency Medicine | Admitting: Emergency Medicine

## 2017-03-16 ENCOUNTER — Encounter: Payer: Self-pay | Admitting: Emergency Medicine

## 2017-03-16 DIAGNOSIS — I10 Essential (primary) hypertension: Secondary | ICD-10-CM | POA: Diagnosis not present

## 2017-03-16 DIAGNOSIS — F419 Anxiety disorder, unspecified: Secondary | ICD-10-CM | POA: Insufficient documentation

## 2017-03-16 DIAGNOSIS — F17200 Nicotine dependence, unspecified, uncomplicated: Secondary | ICD-10-CM | POA: Insufficient documentation

## 2017-03-16 DIAGNOSIS — E119 Type 2 diabetes mellitus without complications: Secondary | ICD-10-CM | POA: Diagnosis not present

## 2017-03-16 DIAGNOSIS — Z79899 Other long term (current) drug therapy: Secondary | ICD-10-CM | POA: Insufficient documentation

## 2017-03-16 LAB — COMPREHENSIVE METABOLIC PANEL
ALK PHOS: 106 U/L (ref 38–126)
ALT: 20 U/L (ref 14–54)
ANION GAP: 12 (ref 5–15)
AST: 20 U/L (ref 15–41)
Albumin: 4.6 g/dL (ref 3.5–5.0)
BILIRUBIN TOTAL: 0.6 mg/dL (ref 0.3–1.2)
BUN: 17 mg/dL (ref 6–20)
CO2: 26 mmol/L (ref 22–32)
Calcium: 9.7 mg/dL (ref 8.9–10.3)
Chloride: 97 mmol/L — ABNORMAL LOW (ref 101–111)
Creatinine, Ser: 0.76 mg/dL (ref 0.44–1.00)
GFR calc non Af Amer: >60 mL/min (ref >60)
Glucose, Bld: 260 mg/dL — ABNORMAL HIGH (ref 65–99)
Potassium: 2.7 mmol/L — CL (ref 3.5–5.1)
SODIUM: 135 mmol/L (ref 135–145)
TOTAL PROTEIN: 8.3 g/dL — AB (ref 6.5–8.1)

## 2017-03-16 LAB — URINE DRUG SCREEN, QUALITATIVE (ARMC ONLY)
Amphetamines, Ur Screen: NOT DETECTED
Barbiturates, Ur Screen: NOT DETECTED
Benzodiazepine, Ur Scrn: NOT DETECTED
Cannabinoid 50 Ng, Ur ~~LOC~~: NOT DETECTED
Cocaine Metabolite,Ur ~~LOC~~: NOT DETECTED
MDMA (ECSTASY) UR SCREEN: NOT DETECTED
METHADONE SCREEN, URINE: NOT DETECTED
Opiate, Ur Screen: NOT DETECTED
Phencyclidine (PCP) Ur S: NOT DETECTED
TRICYCLIC, UR SCREEN: NOT DETECTED

## 2017-03-16 LAB — CBC WITH DIFFERENTIAL/PLATELET
BASOS PCT: 1 %
Basophils Absolute: 0 10*3/uL (ref 0–0.1)
EOS ABS: 0.1 10*3/uL (ref 0–0.7)
Eosinophils Relative: 1 %
HCT: 41.7 % (ref 35.0–47.0)
HEMOGLOBIN: 14.3 g/dL (ref 12.0–16.0)
Lymphocytes Relative: 37 %
Lymphs Abs: 2.3 10*3/uL (ref 1.0–3.6)
MCH: 29.1 pg (ref 26.0–34.0)
MCHC: 34.4 g/dL (ref 32.0–36.0)
MCV: 84.5 fL (ref 80.0–100.0)
Monocytes Absolute: 0.4 10*3/uL (ref 0.2–0.9)
Monocytes Relative: 6 %
NEUTROS PCT: 55 %
Neutro Abs: 3.4 10*3/uL (ref 1.4–6.5)
Platelets: 297 10*3/uL (ref 150–440)
RBC: 4.93 MIL/uL (ref 3.80–5.20)
RDW: 13.2 % (ref 11.5–14.5)
WBC: 6.2 10*3/uL (ref 3.6–11.0)

## 2017-03-16 LAB — ETHANOL: Alcohol, Ethyl (B): 30 mg/dL — ABNORMAL HIGH (ref <5)

## 2017-03-16 MED ORDER — FLUOXETINE HCL 20 MG PO CAPS: 20.0000 mg | ORAL_CAPSULE | Freq: Every day | ORAL | 2 refills | Status: DC

## 2017-03-16 MED ORDER — OLANZAPINE 10 MG PO TABS: 10.0000 mg | ORAL_TABLET | Freq: Every day | ORAL | 2 refills | Status: DC

## 2017-03-16 NOTE — ED Triage Notes (Signed)
Patient states she has been anxious for "too long", denies SI, states needs medication refill. Agreeable to lab work and see couselor.

## 2017-03-16 NOTE — ED Provider Notes (Signed)
[facility] Emergency Department Provider Note   ____________________________________________    I have reviewed the triage vital signs and the nursing notes.   HISTORY  Chief Complaint Anxiety     HPI Molly Daniels is a 56 y.o. female who presents with complaints of anxiety. She reports she ran out of her medications nearly one week ago, she typically takes olanzapine and fluoxetine which helps her with her anxiety and allows her to sleep at night. She denies any SI or HI. She reports she is here for medication refill primarily. No chest pain shortness of breath.   Past Medical History:  Diagnosis Date  . Diabetes mellitus without complication (HCC)   . Hypertension   . Neuropathy     Patient Active Problem List   Diagnosis Date Noted  . Schizoaffective disorder (HCC) 02/06/2017    History reviewed. No pertinent surgical history.  Prior to Admission medications   Medication Sig Start Date End Date Taking? Authorizing Provider  amLODipine (NORVASC) 5 MG tablet Take 1 tablet (5 mg total) by mouth daily. 10/28/16 10/28/17  Joni ReiningSmith, Ronald K, PA-C  amLODipine (NORVASC) 5 MG tablet Take 1 tablet (5 mg total) by mouth daily. 11/29/16 11/29/17  Joni ReiningSmith, Ronald K, PA-C  FLUoxetine (PROZAC) 20 MG capsule Take 1 capsule (20 mg total) by mouth daily. 03/16/17 03/16/18  Jene EveryKinner, Abrar Koone, MD  OLANZapine (ZYPREXA) 10 MG tablet Take 1 tablet (10 mg total) by mouth at bedtime. 03/16/17 03/16/18  Jene EveryKinner, Klyn Kroening, MD  pantoprazole (PROTONIX) 20 MG tablet Take 1 tablet (20 mg total) by mouth daily. 10/28/16   Joni ReiningSmith, Ronald K, PA-C  phenazopyridine (PYRIDIUM) 200 MG tablet Take 1 tablet (200 mg total) by mouth 3 (three) times daily as needed for pain. 11/29/16 11/29/17  Joni ReiningSmith, Ronald K, PA-C  potassium chloride (K-DUR) 10 MEQ tablet Take 1 tablet (10 mEq total) by mouth daily. 10/28/16   Joni ReiningSmith, Ronald K, PA-C  potassium chloride (K-DUR) 10 MEQ tablet Take 1 tablet (10 mEq  total) by mouth daily. 11/29/16   Joni ReiningSmith, Ronald K, PA-C  triamterene-hydrochlorothiazide (MAXZIDE-25) 37.5-25 MG tablet Take 1 tablet by mouth daily. 10/28/16 10/28/17  Joni ReiningSmith, Ronald K, PA-C  triamterene-hydrochlorothiazide (MAXZIDE-25) 37.5-25 MG tablet Take 1 tablet by mouth daily. 11/29/16 11/29/17  Joni ReiningSmith, Ronald K, PA-C     Allergies Abilify [aripiprazole] and Other  No family history on file.  Social History Social History  Substance Use Topics  . Smoking status: Current Every Day Smoker    Packs/day: 0.25  . Smokeless tobacco: Never Used  . Alcohol use 1.2 oz/week    2 Glasses of wine per week    Review of Systems  Constitutional: No Dizziness  ENT: No sore throat.   Gastrointestinal: No abdominal pain.  No nausea, no vomiting.    Neurological: Negative for headaches     ____________________________________________   PHYSICAL EXAM:  VITAL SIGNS: ED Triage Vitals  Enc Vitals Group     BP 03/16/17 1146 (!) 149/94     Pulse Rate 03/16/17 1146 (!) 103     Resp 03/16/17 1146 20     Temp 03/16/17 1146 98.2 F (36.8 C)     Temp Source 03/16/17 1146 Oral     SpO2 03/16/17 1146 98 %     Weight --      Height 03/16/17 1147 5\' 4"  (1.626 m)     Head Circumference --      Peak Flow --      Pain  Score --      Pain Loc --      Pain Edu? --      Excl. in GC? --     Constitutional: Alert and oriented. No acute distress. Pleasant and interactive Eyes: Conjunctivae are normal.  Head: Atraumatic. Nose: No congestion/rhinnorhea. Mouth/Throat: Mucous membranes are moist.   Cardiovascular: Normal rate, regular rhythm.  Respiratory: Normal respiratory effort.  No retractions. Genitourinary: deferred Musculoskeletal: No lower extremity tenderness nor edema.   Neurologic:  Normal speech and language. No gross focal neurologic deficits are appreciated.   Skin:  Skin is warm, dry and intact. No rash noted.   ____________________________________________   LABS (all  labs ordered are listed, but only abnormal results are displayed)  Labs Reviewed  COMPREHENSIVE METABOLIC PANEL - Abnormal; Notable for the following:       Result Value   Potassium 2.7 (*)    Chloride 97 (*)    Glucose, Bld 260 (*)    Total Protein 8.3 (*)    All other components within normal limits  ETHANOL - Abnormal; Notable for the following:    Alcohol, Ethyl (B) 30 (*)    All other components within normal limits  CBC WITH DIFFERENTIAL/PLATELET  URINE DRUG SCREEN, QUALITATIVE ([facility] ONLY)   ____________________________________________  EKG   ____________________________________________  RADIOLOGY  None ____________________________________________   PROCEDURES  Procedure(s) performed: No    Critical Care performed: No ____________________________________________   INITIAL IMPRESSION / ASSESSMENT AND PLAN / ED COURSE  Pertinent labs & imaging results that were available during my care of the patient were reviewed by me and considered in my medical decision making (see chart for details).  Patient well-appearing and in no acute distress. She is a history of hypertension, and asked her to follow up with her PCP for further management of this. I will refill her olanzapine and fluoxetine   ____________________________________________   FINAL CLINICAL IMPRESSION(S) / ED DIAGNOSES  Final diagnoses:  Anxiety      NEW MEDICATIONS STARTED DURING THIS VISIT:  Discharge Medication List as of 03/16/2017  1:25 PM    START taking these medications   Details  FLUoxetine (PROZAC) 20 MG capsule Take 1 capsule (20 mg total) by mouth daily., Starting Mon 03/16/2017, Until Tue 03/16/2018, Print    OLANZapine (ZYPREXA) 10 MG tablet Take 1 tablet (10 mg total) by mouth at bedtime., Starting Mon 03/16/2017, Until Tue 03/16/2018, Print         Note:  This document was prepared using Dragon voice recognition software and may include unintentional dictation errors.      Jene Every, MD 03/16/17 1539

## 2017-03-16 NOTE — ED Notes (Signed)
Date and time results received: 03/16/17 1300 (use smartphrase ".now" to insert current time)  Test: Potassium Critical Value: 2.7  Name of Provider Notified: Don PerkingVeronese  Orders Received? Or Actions Taken?: Bring pt to a room from lobby, tele

## 2017-04-23 ENCOUNTER — Ambulatory Visit: Payer: Medicare Other

## 2017-05-14 ENCOUNTER — Ambulatory Visit
Admission: RE | Admit: 2017-05-14 | Discharge: 2017-05-14 | Disposition: A | Discharge status: Home / Self Care | Payer: Medicare Other | Source: Ambulatory Visit | Attending: Internal Medicine | Admitting: Internal Medicine

## 2017-05-14 DIAGNOSIS — N6310 Unspecified lump in the right breast, unspecified quadrant: Secondary | ICD-10-CM

## 2017-05-14 DIAGNOSIS — N6021 Fibroadenosis of right breast: Secondary | ICD-10-CM | POA: Insufficient documentation

## 2017-05-14 DIAGNOSIS — R928 Other abnormal and inconclusive findings on diagnostic imaging of breast: Secondary | ICD-10-CM

## 2017-05-14 DIAGNOSIS — N6011 Diffuse cystic mastopathy of right breast: Secondary | ICD-10-CM | POA: Diagnosis not present

## 2017-05-14 DIAGNOSIS — N6312 Unspecified lump in the right breast, upper inner quadrant: Secondary | ICD-10-CM | POA: Diagnosis not present

## 2017-05-15 LAB — SURGICAL PATHOLOGY

## 2017-05-20 ENCOUNTER — Other Ambulatory Visit: Payer: Self-pay | Admitting: Internal Medicine

## 2017-05-20 DIAGNOSIS — N6489 Other specified disorders of breast: Secondary | ICD-10-CM

## 2017-11-18 ENCOUNTER — Other Ambulatory Visit: Payer: Self-pay

## 2017-11-18 ENCOUNTER — Emergency Department
Admission: EM | Admit: 2017-11-18 | Discharge: 2017-11-18 | Disposition: A | Discharge status: Home / Self Care | Payer: Medicare HMO | Attending: Emergency Medicine | Admitting: Emergency Medicine

## 2017-11-18 DIAGNOSIS — F17200 Nicotine dependence, unspecified, uncomplicated: Secondary | ICD-10-CM | POA: Insufficient documentation

## 2017-11-18 DIAGNOSIS — I10 Essential (primary) hypertension: Secondary | ICD-10-CM | POA: Diagnosis not present

## 2017-11-18 DIAGNOSIS — Z79899 Other long term (current) drug therapy: Secondary | ICD-10-CM | POA: Diagnosis not present

## 2017-11-18 DIAGNOSIS — M545 Low back pain: Secondary | ICD-10-CM | POA: Diagnosis not present

## 2017-11-18 DIAGNOSIS — N951 Menopausal and female climacteric states: Secondary | ICD-10-CM | POA: Insufficient documentation

## 2017-11-18 DIAGNOSIS — G8929 Other chronic pain: Secondary | ICD-10-CM | POA: Insufficient documentation

## 2017-11-18 DIAGNOSIS — E119 Type 2 diabetes mellitus without complications: Secondary | ICD-10-CM | POA: Diagnosis not present

## 2017-11-18 MED ORDER — IBUPROFEN 600 MG PO TABS
600.0000 mg | ORAL_TABLET | Freq: Once | ORAL | Status: AC
  Administered 2017-11-18: 600 mg via ORAL
  Filled 2017-11-18: qty 1

## 2017-11-18 MED ORDER — IBUPROFEN 600 MG PO TABS: 600.0000 mg | ORAL_TABLET | Freq: Three times a day (TID) | ORAL | 0 refills | Status: AC | PRN

## 2017-11-18 NOTE — Discharge Instructions (Signed)
Call make an appointment with your primary care provider.  You will need a prescription to take new medication that is not documented in your chart that you are taking.  Continue taking your regular medication as prescribed by her doctor. Ibuprofen 600 mg 3 times daily with food as needed for back pain.

## 2017-11-18 NOTE — ED Notes (Signed)
Pt to ER stating that she needs gabapentin for her legs at night. Pain worse at night. Pt also c/o hot flashes. Ambulatory without difficulty. Pt alert and oriented X4, active, cooperative, pt in NAD. RR even and unlabored, color WNL.  No injury.

## 2017-11-18 NOTE — ED Provider Notes (Signed)
[facility] Emergency Department Provider Note  ____________________________________________   First MD Initiated Contact with Patient 11/18/17 1454     (approximate)  I have reviewed the triage vital signs and the nursing notes.   HISTORY  Chief Complaint Back Pain   HPI Molly Daniels is a 57 y.o. female presents today with a request to get a prescription for gabapentin.  Patient states that she has neuropathy and hot flashes and "needs this medicine".  Patient states that [facility] [address] and she was there last week.  She cannot tell me the name of the physician that prescribes her medication.  She is not have an empty bottle of the gabapentin.  She states that she is having right-sided back pain without history of injury.  She denies any urinary symptoms, incontinence of bowel or bladder, any radiculopathy or paresthesias.  She rates her pain is 4 out of 10.   Past Medical History:  Diagnosis Date  . Diabetes mellitus without complication (HCC)   . Hypertension   . Neuropathy     Patient Active Problem List   Diagnosis Date Noted  . Schizoaffective disorder (HCC) 02/06/2017    No past surgical history on file.  Prior to Admission medications   Medication Sig Start Date End Date Taking? Authorizing Provider  amLODipine (NORVASC) 5 MG tablet Take 1 tablet (5 mg total) by mouth daily. 10/28/16 10/28/17  Joni ReiningSmith, Ronald K, PA-C  amLODipine (NORVASC) 5 MG tablet Take 1 tablet (5 mg total) by mouth daily. 11/29/16 11/29/17  Joni ReiningSmith, Ronald K, PA-C  FLUoxetine (PROZAC) 20 MG capsule Take 1 capsule (20 mg total) by mouth daily. 03/16/17 03/16/18  Jene EveryKinner, Robert, MD  ibuprofen (ADVIL,MOTRIN) 600 MG tablet Take 1 tablet (600 mg total) by mouth every 8 (eight) hours as needed for moderate pain. 11/18/17   Tommi RumpsSummers, Jamarie Mussa L, PA-C  OLANZapine (ZYPREXA) 10 MG tablet Take 1 tablet (10 mg total) by mouth at bedtime.  03/16/17 03/16/18  Jene EveryKinner, Robert, MD  pantoprazole (PROTONIX) 20 MG tablet Take 1 tablet (20 mg total) by mouth daily. 10/28/16   Joni ReiningSmith, Ronald K, PA-C  phenazopyridine (PYRIDIUM) 200 MG tablet Take 1 tablet (200 mg total) by mouth 3 (three) times daily as needed for pain. 11/29/16 11/29/17  Joni ReiningSmith, Ronald K, PA-C  potassium chloride (K-DUR) 10 MEQ tablet Take 1 tablet (10 mEq total) by mouth daily. 10/28/16   Joni ReiningSmith, Ronald K, PA-C  potassium chloride (K-DUR) 10 MEQ tablet Take 1 tablet (10 mEq total) by mouth daily. 11/29/16   Joni ReiningSmith, Ronald K, PA-C  triamterene-hydrochlorothiazide (MAXZIDE-25) 37.5-25 MG tablet Take 1 tablet by mouth daily. 10/28/16 10/28/17  Joni ReiningSmith, Ronald K, PA-C  triamterene-hydrochlorothiazide (MAXZIDE-25) 37.5-25 MG tablet Take 1 tablet by mouth daily. 11/29/16 11/29/17  Joni ReiningSmith, Ronald K, PA-C    Allergies Abilify [aripiprazole] and Other  No family history on file.  Social History Social History   Tobacco Use  . Smoking status: Current Every Day Smoker    Packs/day: 0.25  . Smokeless tobacco: Never Used  Substance Use Topics  . Alcohol use: Yes    Alcohol/week: 1.2 oz    Types: 2 Glasses of wine per week  . Drug use: No    Review of Systems Constitutional: No fever/chills Cardiovascular: Denies chest pain. Respiratory: Denies shortness of breath. Gastrointestinal:  No nausea, no vomiting.  Genitourinary: Negative for dysuria. Musculoskeletal: Positive for right-sided back pain. Skin: Negative for rash. Neurological: Negative  for headaches, focal weakness or numbness. ____________________________________________   PHYSICAL EXAM:  VITAL SIGNS: ED Triage Vitals  Enc Vitals Group     BP 11/18/17 1346 135/83     Pulse Rate 11/18/17 1346 (!) 116     Resp 11/18/17 1346 20     Temp 11/18/17 1346 99.3 F (37.4 C)     Temp Source 11/18/17 1346 Oral     SpO2 --      Weight 11/18/17 1348 220 lb (99.8 kg)     Height 11/18/17 1348 5\' 3"  (1.6 m)     Head  Circumference --      Peak Flow --      Pain Score 11/18/17 1345 4     Pain Loc --      Pain Edu? --      Excl. in GC? --    Constitutional: Alert and oriented. Well appearing and in no acute distress.  Patient has a very flat affect. Eyes: Conjunctivae are normal.  Head: Atraumatic. Neck: No stridor.   Cardiovascular: Normal rate, regular rhythm. Grossly normal heart sounds.  Good peripheral circulation. Respiratory: Normal respiratory effort.  No retractions. Lungs CTAB. Gastrointestinal: Soft and nontender. No distention.  No CVA tenderness. Musculoskeletal: Examination of the back there is no gross deformity and there is no point tenderness on palpation of the lumbar spine.  Patient's range of motion is without restriction no muscle spasms were seen.  Normal gait was noted.  There is diffuse generalized tenderness on palpation of the right paravertebral muscles. Neurologic:  Normal speech and language. No gross focal neurologic deficits are appreciated. No gait instability. Skin:  Skin is warm, dry and intact.  No abrasions or soft tissue swelling present. Psychiatric: Mood and affect are normal. Speech and behavior are normal.  ____________________________________________   LABS (all labs ordered are listed, but only abnormal results are displayed)  Labs Reviewed - No data to display   PROCEDURES  Procedure(s) performed: None  Procedures  Critical Care performed: No  ____________________________________________   INITIAL IMPRESSION / ASSESSMENT AND PLAN / ED COURSE  As part of my medical decision making, I reviewed the following data within the electronic MEDICAL RECORD NUMBER Notes from prior ED visits and [place] Controlled Substance Database  [facility] on [address] was called.  Patient has never had a prescription for gabapentin.  Patient then states that she took this 10 years ago from a doctor in [place].  She was told that she would need to see her current  PCP for prescription of this.  It is noted that patient does take Zyprexa and Prozac.  ____________________________________________   FINAL CLINICAL IMPRESSION(S) / ED DIAGNOSES  Final diagnoses:  Chronic right-sided low back pain without sciatica     ED Discharge Orders        Ordered    ibuprofen (ADVIL,MOTRIN) 600 MG tablet  Every 8 hours PRN     11/18/17 1547       Note:  This document was prepared using Dragon voice recognition software and may include unintentional dictation errors.    Tommi Rumps, PA-C 11/18/17 1624    Jeanmarie Plant, MD 11/19/17 [phone]

## 2017-11-18 NOTE — ED Triage Notes (Signed)
Pt reports that she is having neuropathy pain, and hot flashes states that she needs Gabapentin

## 2018-03-16 ENCOUNTER — Other Ambulatory Visit: Payer: Self-pay

## 2018-03-16 ENCOUNTER — Encounter: Payer: Self-pay | Admitting: Emergency Medicine

## 2018-03-16 ENCOUNTER — Emergency Department
Admission: EM | Admit: 2018-03-16 | Discharge: 2018-03-16 | Disposition: A | Discharge status: Home / Self Care | Payer: Medicare HMO | Attending: Emergency Medicine | Admitting: Emergency Medicine

## 2018-03-16 DIAGNOSIS — I10 Essential (primary) hypertension: Secondary | ICD-10-CM | POA: Insufficient documentation

## 2018-03-16 DIAGNOSIS — F259 Schizoaffective disorder, unspecified: Secondary | ICD-10-CM | POA: Insufficient documentation

## 2018-03-16 DIAGNOSIS — F17210 Nicotine dependence, cigarettes, uncomplicated: Secondary | ICD-10-CM | POA: Diagnosis not present

## 2018-03-16 DIAGNOSIS — Z760 Encounter for issue of repeat prescription: Secondary | ICD-10-CM | POA: Diagnosis present

## 2018-03-16 DIAGNOSIS — Z79899 Other long term (current) drug therapy: Secondary | ICD-10-CM | POA: Diagnosis not present

## 2018-03-16 DIAGNOSIS — E119 Type 2 diabetes mellitus without complications: Secondary | ICD-10-CM | POA: Diagnosis not present

## 2018-03-16 MED ORDER — OLANZAPINE 10 MG PO TABS: 10.0000 mg | ORAL_TABLET | Freq: Every day | ORAL | 0 refills | Status: DC

## 2018-03-16 MED ORDER — FLUOXETINE HCL 20 MG PO CAPS: 20.0000 mg | ORAL_CAPSULE | Freq: Every day | ORAL | 0 refills | Status: DC

## 2018-03-16 NOTE — ED Triage Notes (Signed)
Says she is out of her medications and [facility] will not take her [employer] anymore.  They told her to call [facility].  They have to wait for records transfer.

## 2018-03-16 NOTE — ED Provider Notes (Signed)
[facility] Emergency Department Provider Note  ____________________________________________  Time seen: Approximately 2:09 PM  I have reviewed the triage vital signs and the nursing notes.   HISTORY  Chief Complaint Medication Refill    HPI Molly Daniels is a 57 y.o. female that presents to the emergency department for medication refill of Zyprexa and Prozac.  Patient states that she has been on these medicines for 10+ years.  No dosage change.  No side effects from medication.  She was getting prescriptions filled at [facility] but they no longer take her insurance.  She was not able to get in to see [facility] until records are transferred.   Past Medical History:  Diagnosis Date  . Diabetes mellitus without complication (HCC)   . Hypertension   . Neuropathy     Patient Active Problem List   Diagnosis Date Noted  . Schizoaffective disorder (HCC) 02/06/2017    History reviewed. No pertinent surgical history.  Prior to Admission medications   Medication Sig Start Date End Date Taking? Authorizing Provider  amLODipine (NORVASC) 5 MG tablet Take 1 tablet (5 mg total) by mouth daily. 10/28/16 10/28/17  Joni Reining, PA-C  amLODipine (NORVASC) 5 MG tablet Take 1 tablet (5 mg total) by mouth daily. 11/29/16 11/29/17  Joni Reining, PA-C  FLUoxetine (PROZAC) 20 MG capsule Take 1 capsule (20 mg total) by mouth daily. 03/16/18 03/16/19  Enid Derry, PA-C  ibuprofen (ADVIL,MOTRIN) 600 MG tablet Take 1 tablet (600 mg total) by mouth every 8 (eight) hours as needed for moderate pain. 11/18/17   Tommi Rumps, PA-C  OLANZapine (ZYPREXA) 10 MG tablet Take 1 tablet (10 mg total) by mouth at bedtime. 03/16/18 03/16/19  Enid Derry, PA-C  pantoprazole (PROTONIX) 20 MG tablet Take 1 tablet (20 mg total) by mouth daily. 10/28/16   Joni Reining, PA-C  potassium chloride (K-DUR) 10 MEQ tablet Take 1 tablet (10 mEq total) by mouth daily. 10/28/16   Joni Reining, PA-C  potassium chloride (K-DUR) 10 MEQ tablet Take 1 tablet (10 mEq total) by mouth daily. 11/29/16   Joni Reining, PA-C  triamterene-hydrochlorothiazide (MAXZIDE-25) 37.5-25 MG tablet Take 1 tablet by mouth daily. 10/28/16 10/28/17  Joni Reining, PA-C  triamterene-hydrochlorothiazide (MAXZIDE-25) 37.5-25 MG tablet Take 1 tablet by mouth daily. 11/29/16 11/29/17  Joni Reining, PA-C    Allergies Abilify [aripiprazole] and Other  No family history on file.  Social History Social History   Tobacco Use  . Smoking status: Current Every Day Smoker    Packs/day: 0.25  . Smokeless tobacco: Never Used  Substance Use Topics  . Alcohol use: Yes    Alcohol/week: 1.2 oz    Types: 2 Glasses of wine per week  . Drug use: No     Review of Systems  Cardiovascular: No chest pain. Respiratory: No SOB. Gastrointestinal: No abdominal pain.  No nausea, no vomiting.  Musculoskeletal: Negative for musculoskeletal pain. Skin: Negative for rash, abrasions, lacerations, ecchymosis. Neurological: Negative for headaches   ____________________________________________   PHYSICAL EXAM:  VITAL SIGNS: ED Triage Vitals [03/16/18 1157]  Enc Vitals Group     BP (!) 144/91     Pulse Rate 100     Resp 20     Temp 98.4 F (36.9 C)     Temp Source Oral     SpO2 98 %     Weight 207 lb (93.9 kg)     Height  (1.626 m)  Head Circumference      Peak Flow      Pain Score 0     Pain Loc      Pain Edu?      Excl. in GC?      Constitutional: Alert and oriented. Well appearing and in no acute distress. Eyes: Conjunctivae are normal. PERRL. EOMI. Head: Atraumatic. ENT:      Ears:      Nose: No congestion/rhinnorhea.      Mouth/Throat: Mucous membranes are moist.  Neck: No stridor. Cardiovascular: Good peripheral circulation. Respiratory: Normal respiratory effort without tachypnea or retractions. Gastrointestinal: Bowel sounds 4 quadrants. Soft and nontender to palpation.  No guarding or rigidity. No palpable masses. No distention.  Musculoskeletal: Full range of motion to all extremities. No gross deformities appreciated. Neurologic:  Normal speech and language. No gross focal neurologic deficits are appreciated.  Skin:  Skin is warm, dry and intact. No rash noted.   ____________________________________________   LABS (all labs ordered are listed, but only abnormal results are displayed)  Labs Reviewed - No data to display ____________________________________________  EKG   ____________________________________________  RADIOLOGY   No results found.  ____________________________________________    PROCEDURES  Procedure(s) performed:    Procedures    Medications - No data to display   ____________________________________________   INITIAL IMPRESSION / ASSESSMENT AND PLAN / ED COURSE  Pertinent labs & imaging results that were available during my care of the patient were reviewed by me and considered in my medical decision making (see chart for details).  Review of the [place] CSRS was performed in accordance of the [facility] prior to dispensing any controlled drugs.     Patient presented to emergency department for medication refill.  Vital signs and exam are reassuring.  I will refill 1 month of her medications and she will follow-up with [facility] for continued medication.  Patient will be discharged home with prescriptions for Prozac and Zyprexa. Patient is to follow up with [facility] as directed. Patient is given ED precautions to return to the ED for any worsening or new symptoms.     ____________________________________________  FINAL CLINICAL IMPRESSION(S) / ED DIAGNOSES  Final diagnoses:  None      NEW MEDICATIONS STARTED DURING THIS VISIT:  ED Discharge Orders        Ordered    FLUoxetine (PROZAC) 20 MG capsule  Daily     03/16/18 1400    OLANZapine (ZYPREXA) 10 MG tablet  Daily at bedtime      03/16/18 1400          This chart was dictated using voice recognition software/Dragon. Despite best efforts to proofread, errors can occur which can change the meaning. Any change was purely unintentional.    Enid Derry, PA-C 03/16/18 1555    Minna Antis, MD 03/17/18 1102

## 2018-04-23 ENCOUNTER — Encounter: Payer: Self-pay | Admitting: Emergency Medicine

## 2018-04-23 ENCOUNTER — Emergency Department
Admission: EM | Admit: 2018-04-23 | Discharge: 2018-04-23 | Disposition: A | Discharge status: Home / Self Care | Payer: Medicare HMO | Attending: Emergency Medicine | Admitting: Emergency Medicine

## 2018-04-23 DIAGNOSIS — F259 Schizoaffective disorder, unspecified: Secondary | ICD-10-CM | POA: Diagnosis not present

## 2018-04-23 DIAGNOSIS — Z79899 Other long term (current) drug therapy: Secondary | ICD-10-CM | POA: Insufficient documentation

## 2018-04-23 DIAGNOSIS — Z760 Encounter for issue of repeat prescription: Secondary | ICD-10-CM | POA: Diagnosis present

## 2018-04-23 DIAGNOSIS — E119 Type 2 diabetes mellitus without complications: Secondary | ICD-10-CM | POA: Insufficient documentation

## 2018-04-23 DIAGNOSIS — F17200 Nicotine dependence, unspecified, uncomplicated: Secondary | ICD-10-CM | POA: Insufficient documentation

## 2018-04-23 DIAGNOSIS — I10 Essential (primary) hypertension: Secondary | ICD-10-CM | POA: Insufficient documentation

## 2018-04-23 MED ORDER — FLUOXETINE HCL 20 MG PO CAPS: 20.0000 mg | ORAL_CAPSULE | Freq: Every day | ORAL | 1 refills | Status: DC

## 2018-04-23 MED ORDER — OLANZAPINE 10 MG PO TABS: 10.0000 mg | ORAL_TABLET | Freq: Every day | ORAL | 1 refills | Status: DC

## 2018-04-23 NOTE — ED Provider Notes (Signed)
[facility] Emergency Department Provider Note  ____________________________________________   I have reviewed the triage vital signs and the nursing notes. Where available I have reviewed prior notes and, if possible and indicated, outside hospital notes.    HISTORY  Chief Complaint Medication Refill (Psychiatric Medications)    HPI Molly Daniels is a 58 y.o. female the history she tells me of schizoaffective disorder, she states that she is running out of her psych medications for the last couple days, and when she is out of her psych medications as a symptom of that she sometimes hears voices.  This is a chronic problem for her.  She has no SI or HI, she has no desire to herself, she is not having command hallucinations, she states that this is absolutely normal when she is low on her medications and she is been out for a few days.  She states her primary care doctor for some reason will not fill her prescriptions and [facility] will not either, I do not have the ability to verify this however, patient is here requesting medication.  She has not taken an overdose of her medications she is been stably on these medications for over a year.  No other complaints.  Patient was slightly tachycardic she states she was anxious she would be told she would not get the meds.  She presents here voluntarily.  shedoes not feel she needs psychiatric evaluation.    Past Medical History:  Diagnosis Date  . Diabetes mellitus without complication (HCC)   . Hypertension   . Neuropathy     Patient Active Problem List   Diagnosis Date Noted  . Schizoaffective disorder (HCC) 02/06/2017    History reviewed. No pertinent surgical history.  Prior to Admission medications   Medication Sig Start Date End Date Taking? Authorizing Provider  amLODipine (NORVASC) 5 MG tablet Take 1 tablet (5 mg total) by mouth daily. 10/28/16 10/28/17  Joni Reining, PA-C  amLODipine (NORVASC) 5 MG  tablet Take 1 tablet (5 mg total) by mouth daily. 11/29/16 11/29/17  Joni Reining, PA-C  FLUoxetine (PROZAC) 20 MG capsule Take 1 capsule (20 mg total) by mouth daily. 03/16/18 03/16/19  Enid Derry, PA-C  ibuprofen (ADVIL,MOTRIN) 600 MG tablet Take 1 tablet (600 mg total) by mouth every 8 (eight) hours as needed for moderate pain. 11/18/17   Tommi Rumps, PA-C  OLANZapine (ZYPREXA) 10 MG tablet Take 1 tablet (10 mg total) by mouth at bedtime. 03/16/18 03/16/19  Enid Derry, PA-C  pantoprazole (PROTONIX) 20 MG tablet Take 1 tablet (20 mg total) by mouth daily. 10/28/16   Joni Reining, PA-C  potassium chloride (K-DUR) 10 MEQ tablet Take 1 tablet (10 mEq total) by mouth daily. 10/28/16   Joni Reining, PA-C  potassium chloride (K-DUR) 10 MEQ tablet Take 1 tablet (10 mEq total) by mouth daily. 11/29/16   Joni Reining, PA-C  triamterene-hydrochlorothiazide (MAXZIDE-25) 37.5-25 MG tablet Take 1 tablet by mouth daily. 10/28/16 10/28/17  Joni Reining, PA-C  triamterene-hydrochlorothiazide (MAXZIDE-25) 37.5-25 MG tablet Take 1 tablet by mouth daily. 11/29/16 11/29/17  Joni Reining, PA-C    Allergies Abilify [aripiprazole] and Other  No family history on file.  Social History Social History   Tobacco Use  . Smoking status: Current Every Day Smoker    Packs/day: 0.25  . Smokeless tobacco: Never Used  Substance Use Topics  . Alcohol use: Yes    Alcohol/week: 1.2 oz    Types: 2  Glasses of wine per week  . Drug use: No    Review of Systems Constitutional: No fever/chills Eyes: No visual changes. ENT: No sore throat. No stiff neck no neck pain Cardiovascular: Denies chest pain. Respiratory: Denies shortness of breath. Gastrointestinal:   no vomiting.  No diarrhea.  No constipation. Genitourinary: Negative for dysuria. Musculoskeletal: Negative lower extremity swelling Skin: Negative for rash. Neurological: Negative for severe headaches, focal weakness or  numbness.   ____________________________________________   PHYSICAL EXAM:  VITAL SIGNS: ED Triage Vitals [04/23/18 1514]  Enc Vitals Group     BP (!) 128/100     Pulse Rate (!) 108     Resp 18     Temp 98.9 F (37.2 C)     Temp Source Oral     SpO2 97 %     Weight 206 lb (93.4 kg)     Height 5\' 4"  (1.626 m)     Head Circumference      Peak Flow      Pain Score 0     Pain Loc      Pain Edu?      Excl. in GC?     Constitutional: Alert and oriented. Well appearing and in no acute distress. Eyes: Conjunctivae are normal Head: Atraumatic HEENT: No congestion/rhinnorhea. Mucous membranes are moist.  Oropharynx non-erythematous Neck:   Nontender with no meningismus, no masses, no stridor Cardiovascular: Normal rate, regular rhythm. Grossly normal heart sounds.  Good peripheral circulation. Respiratory: Normal respiratory effort.  No retractions. Lungs CTAB. Abdominal: Soft and nontender. No distention. No guarding no rebound Back:  There is no focal tenderness or step off.  there is no midline tenderness there are no lesions noted. there is no CVA tenderness  Musculoskeletal: No lower extremity tenderness, no upper extremity tenderness. No joint effusions, no DVT signs strong distal pulses no edema Neurologic:  Normal speech and language. No gross focal neurologic deficits are appreciated.  Skin:  Skin is warm, dry and intact. No rash noted. Psychiatric: Mood and affect are normal. Speech and behavior are normal.  ____________________________________________   LABS (all labs ordered are listed, but only abnormal results are displayed)  Labs Reviewed - No data to display  Pertinent labs  results that were available during my care of the patient were reviewed by me and considered in my medical decision making (see chart for details). ____________________________________________  EKG  I personally interpreted any EKGs ordered by me or  triage  ____________________________________________  RADIOLOGY  Pertinent labs & imaging results that were available during my care of the patient were reviewed by me and considered in my medical decision making (see chart for details). If possible, patient and/or family made aware of any abnormal findings.  No results found. ____________________________________________    PROCEDURES  Procedure(s) performed: None  Procedures  Critical Care performed: None  ____________________________________________   INITIAL IMPRESSION / ASSESSMENT AND PLAN / ED COURSE  Pertinent labs & imaging results that were available during my care of the patient were reviewed by me and considered in my medical decision making (see chart for details).  Patient here requesting a refill of her olanzapine and her Prozac, she did bring the bottle in.  We will refill it for her.  There is no evidence of decompensated psychiatric disease at this time, patient states she sometimes hears voices and this is just a symptom that she is out of her medications, she does not meet any criteria for involuntary commitment she does not  want to voluntarily see any psychiatrist, she is alert and oriented, and she is grateful for our care.  We will refer her to [facility] perhaps they can help direct her to other places that are willing to fill her prescriptions besides the emergency department.  Extensive return precautions and follow-up given understood.    ____________________________________________   FINAL CLINICAL IMPRESSION(S) / ED DIAGNOSES  Final diagnoses:  None      This chart was dictated using voice recognition software.  Despite best efforts to proofread,  errors can occur which can change meaning.      Jeanmarie PlantMcShane, Desma Wilkowski A, MD 04/23/18 1531

## 2018-04-23 NOTE — ED Notes (Signed)
First Nurse Note: Pt ambulatory to ED stating that she needs refills on her psych medications. Pt is in NAD at this time.

## 2018-04-23 NOTE — Discharge Instructions (Addendum)
Return to the emergency room if you have thoughts of hurting yourself or anyone else or you have any other new or worrisome symptoms.

## 2018-04-23 NOTE — ED Notes (Signed)
Pt speaking with EDP. Calm and cooperative. Maintained on 15 minute checks and observation by security camera for safety.

## 2018-04-23 NOTE — ED Triage Notes (Addendum)
Patient presents to the ED after running out of her "psych meds" 2 days ago.  Patient states her PCP is unable to prescribe them and [facility] does not take patient's insurance.  Patient is alert and oriented and calm and at this time with exagerated make-up.  Patient is out of her prozac and zyprexa.  Patient states she has been hearing voices since she has been off the medication.  Patient calmly states, "it's just because I'm off the medication." Patient denies SI and HI.

## 2018-06-01 ENCOUNTER — Other Ambulatory Visit: Payer: Self-pay

## 2018-06-01 ENCOUNTER — Encounter: Payer: Self-pay | Admitting: Emergency Medicine

## 2018-06-01 ENCOUNTER — Emergency Department
Admission: EM | Admit: 2018-06-01 | Discharge: 2018-06-01 | Disposition: A | Discharge status: Home / Self Care | Payer: Medicare HMO | Attending: Emergency Medicine | Admitting: Emergency Medicine

## 2018-06-01 DIAGNOSIS — E119 Type 2 diabetes mellitus without complications: Secondary | ICD-10-CM | POA: Insufficient documentation

## 2018-06-01 DIAGNOSIS — F17200 Nicotine dependence, unspecified, uncomplicated: Secondary | ICD-10-CM | POA: Insufficient documentation

## 2018-06-01 DIAGNOSIS — I10 Essential (primary) hypertension: Secondary | ICD-10-CM | POA: Insufficient documentation

## 2018-06-01 DIAGNOSIS — Z760 Encounter for issue of repeat prescription: Secondary | ICD-10-CM | POA: Diagnosis not present

## 2018-06-01 DIAGNOSIS — M25551 Pain in right hip: Secondary | ICD-10-CM | POA: Insufficient documentation

## 2018-06-01 DIAGNOSIS — Z79899 Other long term (current) drug therapy: Secondary | ICD-10-CM | POA: Insufficient documentation

## 2018-06-01 MED ORDER — OLANZAPINE 10 MG PO TABS: 10.0000 mg | ORAL_TABLET | Freq: Every day | ORAL | 2 refills | Status: AC

## 2018-06-01 MED ORDER — MELOXICAM 15 MG PO TABS: 15.0000 mg | ORAL_TABLET | Freq: Every day | ORAL | 1 refills | Status: AC

## 2018-06-01 MED ORDER — FLUOXETINE HCL 20 MG PO CAPS: 20.0000 mg | ORAL_CAPSULE | Freq: Every day | ORAL | 2 refills | Status: AC

## 2018-06-01 NOTE — ED Notes (Signed)
See triage note  Presents with medication refill

## 2018-06-01 NOTE — ED Provider Notes (Signed)
[facility] Emergency Department Provider Note  ____________________________________________  Time seen: Approximately 4:05 PM  I have reviewed the triage vital signs and the nursing notes.   HISTORY  Chief Complaint Medication Refill    HPI Molly Daniels is a 57 y.o. female presents to the emergency department for medication refill of fluoxetine and Zyprexa.  Patient reports that she has had difficulty in establishing care with a psychiatrist due to a lack of providers that accept [employer].  Patient apologizes for seeking care at the emergency department but states that she "does not want to end up in a psych ward".  She denies suicidal or homicidal ideation and reports that she is tolerating her medications without complication.  Patient secondarily reports mild, 2 out of 10 sore right hip pain.  Patient reports that she was previously diagnosed with osteoarthritis of the right hip and would like an anti-inflammatory for pain.   Past Medical History:  Diagnosis Date  . Diabetes mellitus without complication (HCC)   . Hypertension   . Neuropathy     Patient Active Problem List   Diagnosis Date Noted  . Schizoaffective disorder (HCC) 02/06/2017    History reviewed. No pertinent surgical history.  Prior to Admission medications   Medication Sig Start Date End Date Taking? Authorizing Provider  amLODipine (NORVASC) 5 MG tablet Take 1 tablet (5 mg total) by mouth daily. 10/28/16 10/28/17  Joni ReiningSmith, Ronald K, PA-C  amLODipine (NORVASC) 5 MG tablet Take 1 tablet (5 mg total) by mouth daily. 11/29/16 11/29/17  Joni ReiningSmith, Ronald K, PA-C  FLUoxetine (PROZAC) 20 MG capsule Take 1 capsule (20 mg total) by mouth daily. 06/01/18 07/01/18  Orvil FeilWoods, Sebastien Jackson M, PA-C  ibuprofen (ADVIL,MOTRIN) 600 MG tablet Take 1 tablet (600 mg total) by mouth every 8 (eight) hours as needed for moderate pain. 11/18/17   Tommi RumpsSummers, Rhonda L, PA-C  meloxicam (MOBIC) 15 MG tablet Take 1 tablet (15 mg  total) by mouth daily for 7 days. 06/01/18 06/08/18  Pia MauWoods, Excell Neyland M, PA-C  OLANZapine (ZYPREXA) 10 MG tablet Take 1 tablet (10 mg total) by mouth daily. 06/01/18 07/01/18  Orvil FeilWoods, Yeraldy Spike M, PA-C  pantoprazole (PROTONIX) 20 MG tablet Take 1 tablet (20 mg total) by mouth daily. 10/28/16   Joni ReiningSmith, Ronald K, PA-C  potassium chloride (K-DUR) 10 MEQ tablet Take 1 tablet (10 mEq total) by mouth daily. 10/28/16   Joni ReiningSmith, Ronald K, PA-C  potassium chloride (K-DUR) 10 MEQ tablet Take 1 tablet (10 mEq total) by mouth daily. 11/29/16   Joni ReiningSmith, Ronald K, PA-C  triamterene-hydrochlorothiazide (MAXZIDE-25) 37.5-25 MG tablet Take 1 tablet by mouth daily. 10/28/16 10/28/17  Joni ReiningSmith, Ronald K, PA-C  triamterene-hydrochlorothiazide (MAXZIDE-25) 37.5-25 MG tablet Take 1 tablet by mouth daily. 11/29/16 11/29/17  Joni ReiningSmith, Ronald K, PA-C    Allergies Abilify [aripiprazole] and Other  History reviewed. No pertinent family history.  Social History Social History   Tobacco Use  . Smoking status: Current Every Day Smoker    Packs/day: 0.25  . Smokeless tobacco: Never Used  Substance Use Topics  . Alcohol use: Yes    Alcohol/week: 1.2 oz    Types: 2 Glasses of wine per week  . Drug use: No     Review of Systems  Constitutional: No fever/chills Eyes: No visual changes. No discharge ENT: No upper respiratory complaints. Cardiovascular: no chest pain. Respiratory: no cough. No SOB. Gastrointestinal: No abdominal pain.  No nausea, no vomiting.  No diarrhea.  No constipation. Genitourinary: Negative for dysuria. No hematuria  Musculoskeletal: Patient has right hip pain.  Skin: Negative for rash, abrasions, lacerations, ecchymosis. Neurological: Negative for headaches, focal weakness or numbness.  ____________________________________________   PHYSICAL EXAM:  VITAL SIGNS: ED Triage Vitals  Enc Vitals Group     BP 06/01/18 1508 (!) 161/94     Pulse Rate 06/01/18 1508 (!) 115     Resp 06/01/18 1508 18     Temp  06/01/18 1508 98 F (36.7 C)     Temp Source 06/01/18 1508 Oral     SpO2 06/01/18 1508 97 %     Weight 06/01/18 1509 198 lb (89.8 kg)     Height 06/01/18 1509 5\' 4"  (1.626 m)     Head Circumference --      Peak Flow --      Pain Score 06/01/18 1509 8     Pain Loc --      Pain Edu? --      Excl. in GC? --      Constitutional: Alert and oriented. Well appearing and in no acute distress. Eyes: Conjunctivae are normal. PERRL. EOMI. Head: Atraumatic. Cardiovascular: Normal rate, regular rhythm. Normal S1 and S2.  Good peripheral circulation. Respiratory: Normal respiratory effort without tachypnea or retractions. Lungs CTAB. Good air entry to the bases with no decreased or absent breath sounds. Gastrointestinal: Bowel sounds 4 quadrants. Soft and nontender to palpation. No guarding or rigidity. No palpable masses. No distention. No CVA tenderness. Musculoskeletal: Full range of motion to all extremities. No gross deformities appreciated. Neurologic:  Normal speech and language. No gross focal neurologic deficits are appreciated.  Skin:  Skin is warm, dry and intact. No rash noted. Psychiatric: Mood and affect are normal. Speech and behavior are normal. Patient exhibits appropriate insight and judgement.   ____________________________________________   LABS (all labs ordered are listed, but only abnormal results are displayed)  Labs Reviewed - No data to display ____________________________________________  EKG   ____________________________________________  RADIOLOGY  No results found.  ____________________________________________    PROCEDURES  Procedure(s) performed:    Procedures    Medications - No data to display   ____________________________________________   INITIAL IMPRESSION / ASSESSMENT AND PLAN / ED COURSE  Pertinent labs & imaging results that were available during my care of the patient were reviewed by me and considered in my medical  decision making (see chart for details).  Review of the [place] CSRS was performed in accordance of the [facility] prior to dispensing any controlled drugs.    Assessment and plan Medication refill Patient presents to the emergency department for a medication refill of fluoxetine and Zyprexa.  Refills were given.  Patient  also requested an anti-inflammatory for right hip osteoarthritis.  Patient was discharged with meloxicam.  She was advised to establish care with a primary care provider soon as possible.  All patient questions were answered.    ____________________________________________  FINAL CLINICAL IMPRESSION(S) / ED DIAGNOSES  Final diagnoses:  Medication refill      NEW MEDICATIONS STARTED DURING THIS VISIT:  ED Discharge Orders        Ordered    OLANZapine (ZYPREXA) 10 MG tablet  Daily     06/01/18 1550    FLUoxetine (PROZAC) 20 MG capsule  Daily     06/01/18 1550    meloxicam (MOBIC) 15 MG tablet  Daily     06/01/18 1550          This chart was dictated using voice recognition software/Dragon. Despite best efforts to proofread, errors  can occur which can change the meaning. Any change was purely unintentional.    Orvil Feil, PA-C 06/01/18 1612    Emily Filbert, MD 06/01/18 [phone]

## 2018-06-01 NOTE — ED Triage Notes (Signed)
Pt is here for a medication refill of olanzapine and fluoxetine and has the Rx bottle with her.

## 2018-06-01 NOTE — ED Triage Notes (Signed)
Pt here for med refill.

## 2018-06-01 NOTE — ED Notes (Signed)
See triage note. Also, pt c/o right hip pain that began Saturday. No injury. 8/10 sharp pain. Hurts worse while walking.

## 2019-03-10 ENCOUNTER — Emergency Department (HOSPITAL_COMMUNITY)
Admission: EM | Admit: 2019-03-10 | Discharge: 2019-03-10 | Disposition: A | Discharge status: Home / Self Care | Payer: Medicare HMO | Attending: Emergency Medicine | Admitting: Emergency Medicine

## 2019-03-10 ENCOUNTER — Other Ambulatory Visit: Payer: Self-pay

## 2019-03-10 ENCOUNTER — Encounter (HOSPITAL_COMMUNITY): Payer: Self-pay | Admitting: Emergency Medicine

## 2019-03-10 DIAGNOSIS — Z7984 Long term (current) use of oral hypoglycemic drugs: Secondary | ICD-10-CM | POA: Insufficient documentation

## 2019-03-10 DIAGNOSIS — I10 Essential (primary) hypertension: Secondary | ICD-10-CM | POA: Diagnosis not present

## 2019-03-10 DIAGNOSIS — Z79899 Other long term (current) drug therapy: Secondary | ICD-10-CM | POA: Insufficient documentation

## 2019-03-10 DIAGNOSIS — Z760 Encounter for issue of repeat prescription: Secondary | ICD-10-CM | POA: Diagnosis not present

## 2019-03-10 DIAGNOSIS — F419 Anxiety disorder, unspecified: Secondary | ICD-10-CM | POA: Diagnosis not present

## 2019-03-10 DIAGNOSIS — F17200 Nicotine dependence, unspecified, uncomplicated: Secondary | ICD-10-CM | POA: Diagnosis not present

## 2019-03-10 DIAGNOSIS — E119 Type 2 diabetes mellitus without complications: Secondary | ICD-10-CM | POA: Insufficient documentation

## 2019-03-10 MED ORDER — GLIPIZIDE 10 MG PO TABS: 10.0000 mg | ORAL_TABLET | Freq: Every day | ORAL | 0 refills | Status: AC

## 2019-03-10 MED ORDER — POTASSIUM CHLORIDE ER 20 MEQ PO TBCR: 20.0000 meq | EXTENDED_RELEASE_TABLET | Freq: Every day | ORAL | 0 refills | Status: AC

## 2019-03-10 MED ORDER — TRAMADOL HCL 50 MG PO TABS: 50.0000 mg | ORAL_TABLET | Freq: Four times a day (QID) | ORAL | 0 refills | Status: AC | PRN

## 2019-03-10 NOTE — ED Notes (Signed)
Bed: [facility] Expected date:  Expected time:  Means of arrival:  Comments: 
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
## 2019-03-10 NOTE — ED Provider Notes (Signed)
[facility]-EMERGENCY DEPT Provider Note   CSN: [id number] Arrival date & time: 03/10/19  1423    History   Chief Complaint Chief Complaint  Patient presents with  . Medication Refill    HPI Molly Daniels is a 58 y.o. female.     HPI Patient presents to the emergency department with need for refill of her potassium and glipizide.  The patient states she does not have a primary care doctor and she needs these refilled.  The patient states that nothing seems make condition better or worse.  She states she run out 2 days ago.  She has no complaints at this time. Past Medical History:  Diagnosis Date  . Diabetes mellitus without complication (HCC)   . Hypertension   . Neuropathy     Patient Active Problem List   Diagnosis Date Noted  . Schizoaffective disorder (HCC) 02/06/2017    History reviewed. No pertinent surgical history.   OB History   No obstetric history on file.      Home Medications    Prior to Admission medications   Medication Sig Start Date End Date Taking? Authorizing Provider  amLODipine (NORVASC) 5 MG tablet Take 1 tablet (5 mg total) by mouth daily. 10/28/16 10/28/17  Joni ReiningSmith, Ronald K, PA-C  amLODipine (NORVASC) 5 MG tablet Take 1 tablet (5 mg total) by mouth daily. 11/29/16 11/29/17  Joni ReiningSmith, Ronald K, PA-C  FLUoxetine (PROZAC) 20 MG capsule Take 1 capsule (20 mg total) by mouth daily. 06/01/18 07/01/18  Orvil FeilWoods, Jaclyn M, PA-C  ibuprofen (ADVIL,MOTRIN) 600 MG tablet Take 1 tablet (600 mg total) by mouth every 8 (eight) hours as needed for moderate pain. 11/18/17   Tommi RumpsSummers, Rhonda L, PA-C  OLANZapine (ZYPREXA) 10 MG tablet Take 1 tablet (10 mg total) by mouth daily. 06/01/18 07/01/18  Orvil FeilWoods, Jaclyn M, PA-C  pantoprazole (PROTONIX) 20 MG tablet Take 1 tablet (20 mg total) by mouth daily. 10/28/16   Joni ReiningSmith, Ronald K, PA-C  potassium chloride (K-DUR) 10 MEQ tablet Take 1 tablet (10 mEq total) by mouth daily. 10/28/16   Joni ReiningSmith, Ronald K, PA-C   potassium chloride (K-DUR) 10 MEQ tablet Take 1 tablet (10 mEq total) by mouth daily. 11/29/16   Joni ReiningSmith, Ronald K, PA-C  triamterene-hydrochlorothiazide (MAXZIDE-25) 37.5-25 MG tablet Take 1 tablet by mouth daily. 10/28/16 10/28/17  Joni ReiningSmith, Ronald K, PA-C  triamterene-hydrochlorothiazide (MAXZIDE-25) 37.5-25 MG tablet Take 1 tablet by mouth daily. 11/29/16 11/29/17  Joni ReiningSmith, Ronald K, PA-C    Family History No family history on file.  Social History Social History   Tobacco Use  . Smoking status: Current Every Day Smoker    Packs/day: 0.25  . Smokeless tobacco: Never Used  Substance Use Topics  . Alcohol use: Yes    Alcohol/week: 2.0 standard drinks    Types: 2 Glasses of wine per week  . Drug use: No     Allergies   Abilify [aripiprazole] and Other   Review of Systems Review of Systems All other systems negative except as documented in the HPI. All pertinent positives and negatives as reviewed in the HPI.  Physical Exam Updated Vital Signs BP (!) 153/104   Pulse (!) 110   Temp 98.9 F (37.2 C) (Oral)   Resp 18   SpO2 100%   Physical Exam Vitals signs and nursing note reviewed.  Constitutional:      General: She is not in acute distress.    Appearance: She is well-developed.  HENT:     Head:  Normocephalic and atraumatic.  Eyes:     Pupils: Pupils are equal, round, and reactive to light.  Pulmonary:     Effort: Pulmonary effort is normal.  Skin:    General: Skin is warm and dry.  Neurological:     Mental Status: She is alert and oriented to person, place, and time.  Psychiatric:        Mood and Affect: Mood is anxious.      ED Treatments / Results  Labs (all labs ordered are listed, but only abnormal results are displayed) Labs Reviewed - No data to display  EKG None  Radiology No results found.  Procedures Procedures (including critical care time)  Medications Ordered in ED Medications - No data to display   Initial Impression / Assessment and  Plan / ED Course  I have reviewed the triage vital signs and the nursing notes.  Pertinent labs & imaging results that were available during my care of the patient were reviewed by me and considered in my medical decision making (see chart for details).        Will prescribe the patient a refill of her medications.  Told to return here for any worsening in her condition.  Also advised her she will need to find a primary care doctor for further care and medication refills.  Final Clinical Impressions(s) / ED Diagnoses   Final diagnoses:  None    ED Discharge Orders    None       Charlestine Night, PA-C 03/10/19 1450    Wynetta Fines, MD 03/11/19 [phone]

## 2019-03-10 NOTE — Discharge Instructions (Signed)
Return here as needed.  You will need to find a primary doctor for further management of your medications.

## 2019-03-26 IMAGING — US US BREAST*R* LIMITED INC AXILLA
1 series · 13 of 16 positions shown · non-contrast
Comparison: Screening mammogram dated 02/10/2017

CLINICAL DATA: Bilateral screening recall.

EXAM:
2D DIGITAL DIAGNOSTIC BILATERAL MAMMOGRAM WITH CAD AND ADJUNCT TOMO
BILATERAL BREAST ULTRASOUND

[Series 1: us breast*right* limited inc axilla · 0.07mm/px · 13 of 16 slices shown]
[im 1/16]
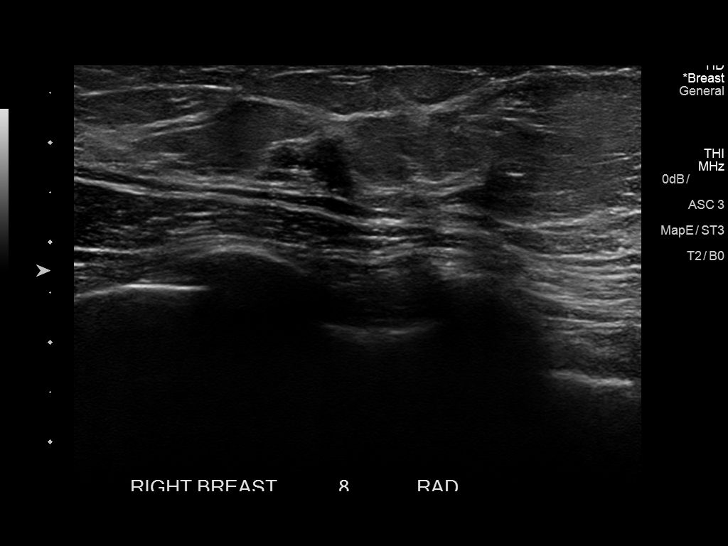
[im 2/16]
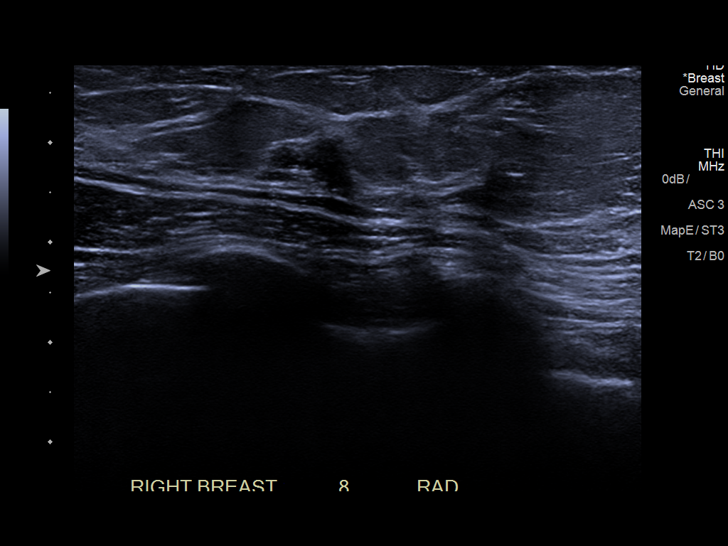
[im 4/16]
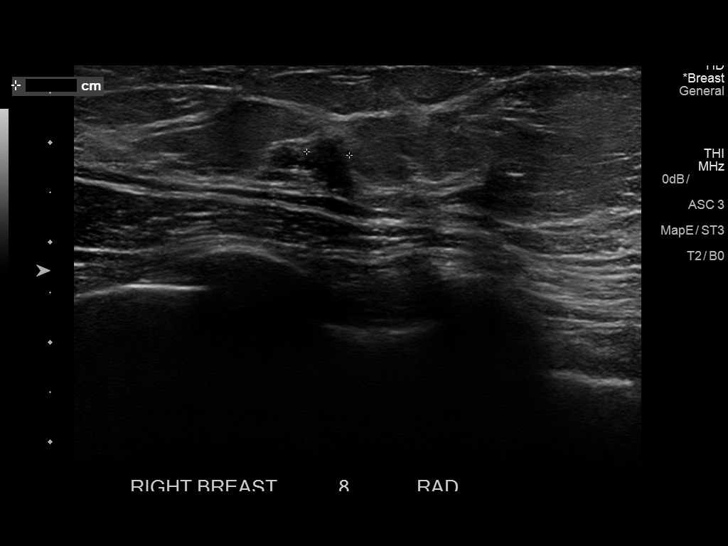
[im 5/16]
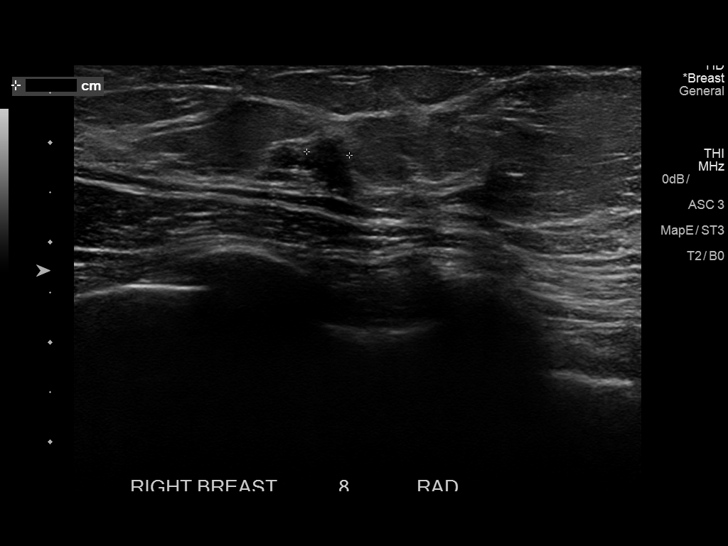
[im 6/16]
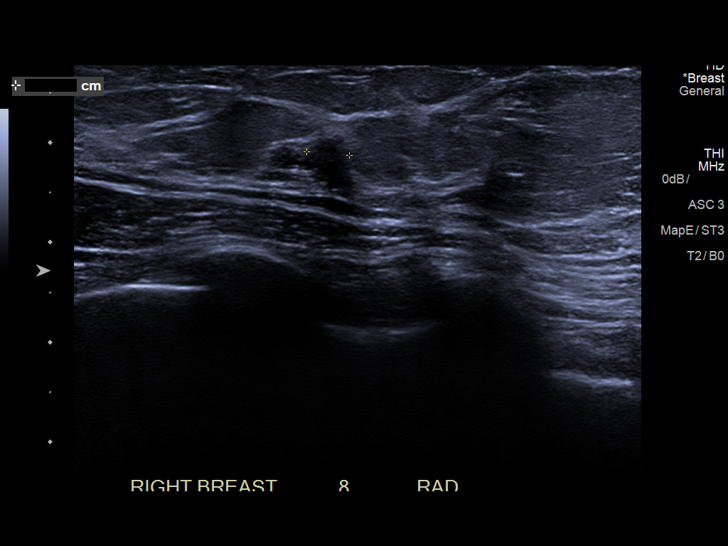
[im 7/16]
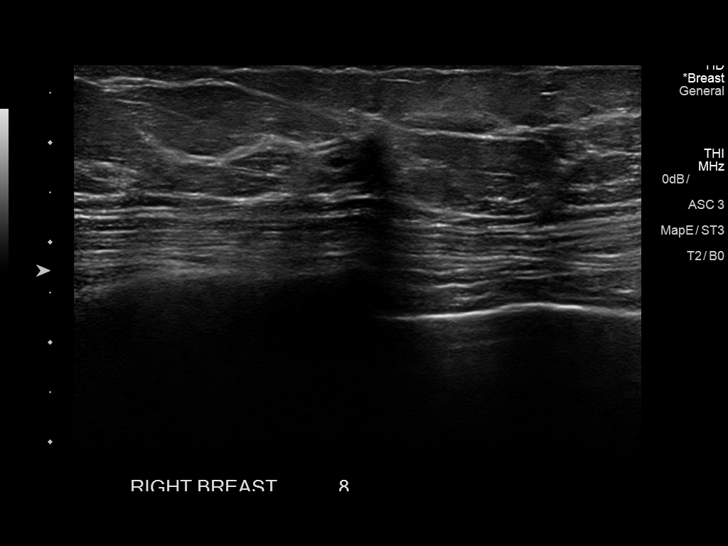
[im 9/16]
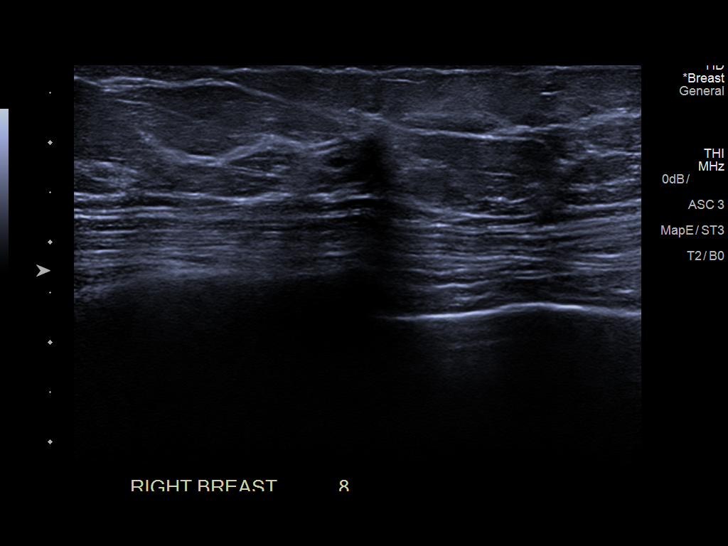
[im 10/16]
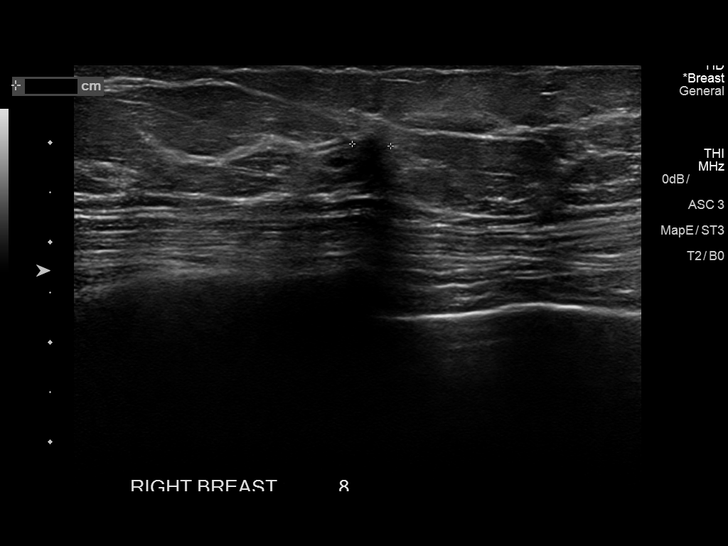
[im 11/16]
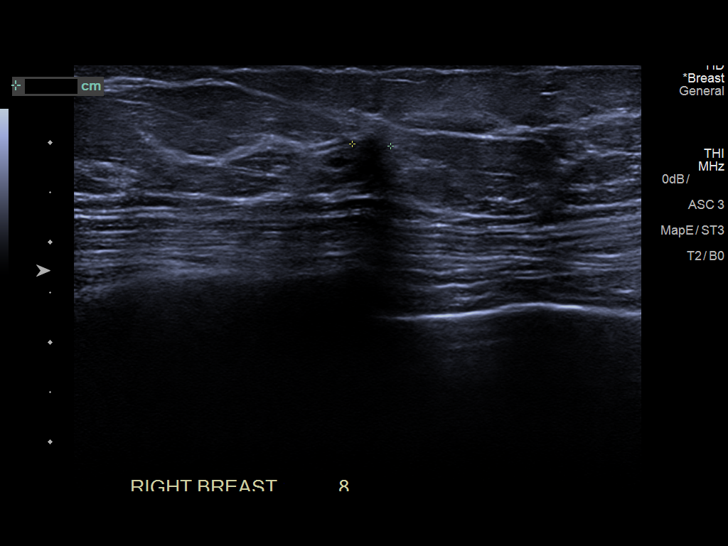
[im 12/16]
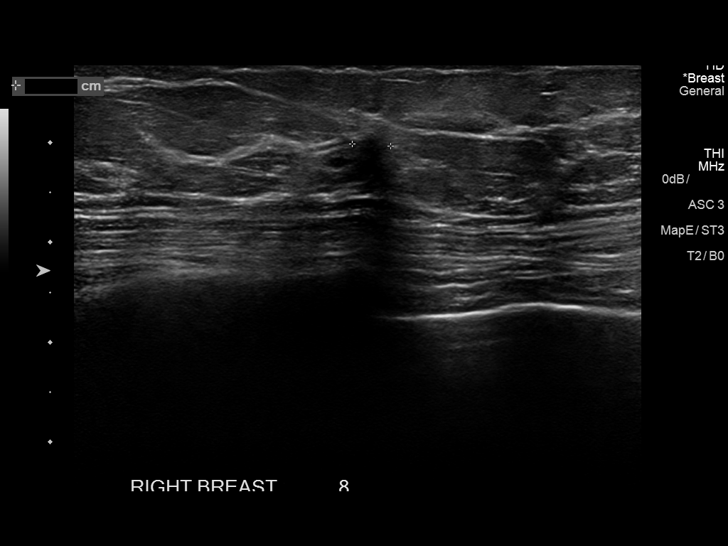
[im 13/16]
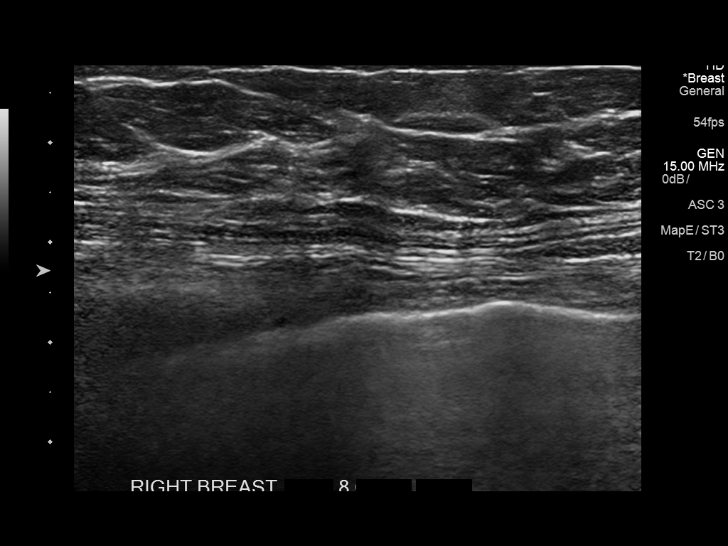
[im 15/16]
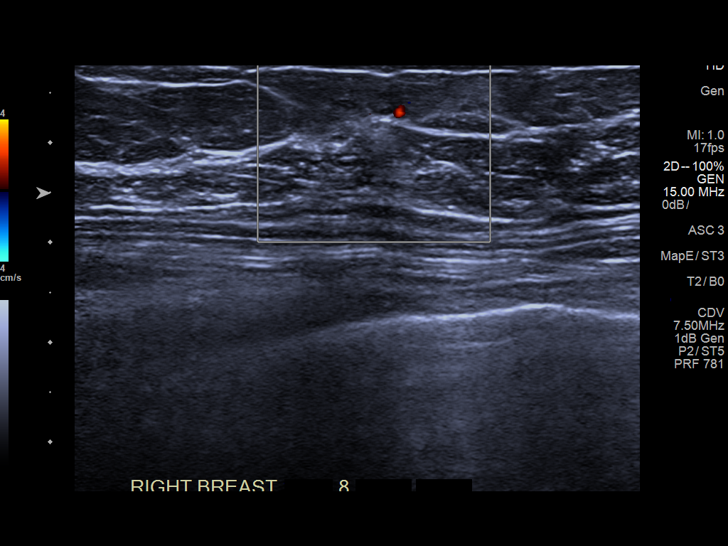
[im 16/16]
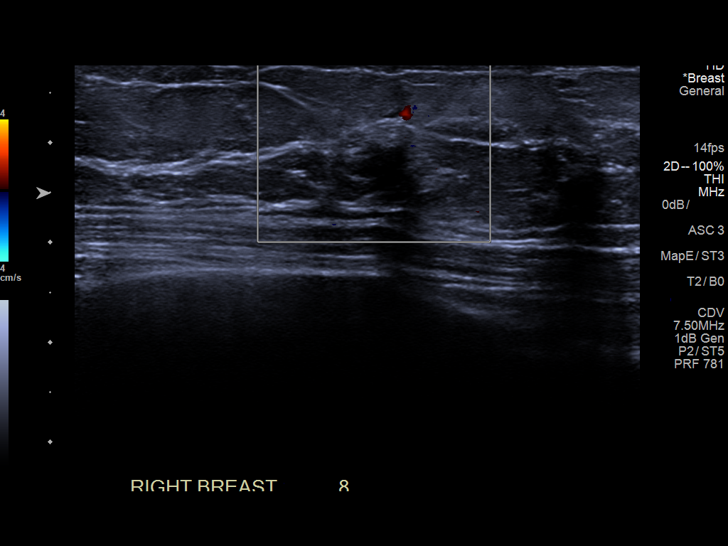

[13 of 16 positions shown; findings below may reference images not displayed]

ACR Breast Density Category b: There are scattered areas of
fibroglandular density.
FINDINGS: There is a persistent lobulated focal asymmetry in the upper inner
quadrant of the right breast, far posterior depth. There is a second
irregular focal asymmetry in the upper-outer quadrant of the left
breast, middle depth.

Mammographic images were processed with CAD.

Ultrasound of the superior aspect of the right breast at 12 o'clock,
8 cm from the nipple demonstrates an irregular hypoechoic shadowing
mass measuring 5 x 4 x 4 mm.

Ultrasound of the upper-outer quadrant of the left breast
demonstrates normal fibroglandular tissue. No masses or suspicious
areas of shadowing are identified.
IMPRESSION: 1. There is an indeterminate mass in the right breast at 12 o'clock.

2. There is a probably benign focal asymmetry in the upper-outer
quadrant of the left breast.

RECOMMENDATION:
1. Ultrasound-guided biopsy is recommended for the right breast mass
at 12 o'clock.

2. If the above biopsy is benign, six-month follow-up diagnostic
mammogram of the left breast is recommended. The above biopsy is
malignant, consider stereotactic biopsy of the focal asymmetry in
the upper-outer left breast.

I have discussed the findings and recommendations with the patient.
Results were also provided in writing at the conclusion of the
visit. If applicable, a reminder letter will be sent to the patient
regarding the next appointment.

BI-RADS CATEGORY  4: Suspicious.

## 2019-04-18 ENCOUNTER — Other Ambulatory Visit: Payer: Self-pay

## 2019-04-18 ENCOUNTER — Emergency Department (HOSPITAL_COMMUNITY)
Admission: EM | Admit: 2019-04-18 | Discharge: 2019-04-18 | Disposition: A | Discharge status: Home / Self Care | Payer: Medicare HMO | Attending: Emergency Medicine | Admitting: Emergency Medicine

## 2019-04-18 ENCOUNTER — Encounter (HOSPITAL_COMMUNITY): Payer: Self-pay

## 2019-04-18 DIAGNOSIS — Z79899 Other long term (current) drug therapy: Secondary | ICD-10-CM | POA: Diagnosis not present

## 2019-04-18 DIAGNOSIS — F17200 Nicotine dependence, unspecified, uncomplicated: Secondary | ICD-10-CM | POA: Diagnosis not present

## 2019-04-18 DIAGNOSIS — I10 Essential (primary) hypertension: Secondary | ICD-10-CM | POA: Insufficient documentation

## 2019-04-18 DIAGNOSIS — E1140 Type 2 diabetes mellitus with diabetic neuropathy, unspecified: Secondary | ICD-10-CM | POA: Diagnosis not present

## 2019-04-18 DIAGNOSIS — R1030 Lower abdominal pain, unspecified: Secondary | ICD-10-CM | POA: Diagnosis not present

## 2019-04-18 DIAGNOSIS — Z7984 Long term (current) use of oral hypoglycemic drugs: Secondary | ICD-10-CM | POA: Diagnosis not present

## 2019-04-18 LAB — LIPASE, BLOOD: Lipase: 24 U/L (ref 11–51)

## 2019-04-18 LAB — COMPREHENSIVE METABOLIC PANEL
ALT: 19 U/L (ref 0–44)
AST: 15 U/L (ref 15–41)
Albumin: 4.7 g/dL (ref 3.5–5.0)
Alkaline Phosphatase: 156 U/L — ABNORMAL HIGH (ref 38–126)
Anion gap: 14 (ref 5–15)
BUN: 12 mg/dL (ref 6–20)
CO2: 24 mmol/L (ref 22–32)
Calcium: 9.5 mg/dL (ref 8.9–10.3)
Chloride: 104 mmol/L (ref 98–111)
Creatinine, Ser: 0.76 mg/dL (ref 0.44–1.00)
GFR calc Af Amer: >60 mL/min (ref >60)
GFR calc non Af Amer: >60 mL/min (ref >60)
Glucose, Bld: 203 mg/dL — ABNORMAL HIGH (ref 70–99)
Potassium: 3.1 mmol/L — ABNORMAL LOW (ref 3.5–5.1)
Sodium: 142 mmol/L (ref 135–145)
Total Bilirubin: 0.4 mg/dL (ref 0.3–1.2)
Total Protein: 8.2 g/dL — ABNORMAL HIGH (ref 6.5–8.1)

## 2019-04-18 LAB — CBC WITH DIFFERENTIAL/PLATELET
Abs Immature Granulocytes: 0.03 10*3/uL (ref 0.00–0.07)
Basophils Absolute: 0 10*3/uL (ref 0.0–0.1)
Basophils Relative: 0 %
Eosinophils Absolute: 0.1 10*3/uL (ref 0.0–0.5)
Eosinophils Relative: 2 %
HCT: 40.5 % (ref 36.0–46.0)
Hemoglobin: 13.1 g/dL (ref 12.0–15.0)
Immature Granulocytes: 1 %
Lymphocytes Relative: 46 %
Lymphs Abs: 3.1 10*3/uL (ref 0.7–4.0)
MCH: 27.1 pg (ref 26.0–34.0)
MCHC: 32.3 g/dL (ref 30.0–36.0)
MCV: 83.7 fL (ref 80.0–100.0)
Monocytes Absolute: 0.4 10*3/uL (ref 0.1–1.0)
Monocytes Relative: 6 %
Neutro Abs: 3 10*3/uL (ref 1.7–7.7)
Neutrophils Relative %: 45 %
Platelets: 301 10*3/uL (ref 150–400)
RBC: 4.84 MIL/uL (ref 3.87–5.11)
RDW: 13.7 % (ref 11.5–15.5)
WBC: 6.6 10*3/uL (ref 4.0–10.5)
nRBC: 0 % (ref 0.0–0.2)

## 2019-04-18 MED ORDER — GABAPENTIN 300 MG PO CAPS: 300.0000 mg | ORAL_CAPSULE | Freq: Three times a day (TID) | ORAL | 0 refills | Status: AC

## 2019-04-18 MED ORDER — TRAMADOL HCL 50 MG PO TABS: 50.0000 mg | ORAL_TABLET | Freq: Four times a day (QID) | ORAL | 0 refills | Status: AC | PRN

## 2019-04-18 MED ORDER — GLIPIZIDE 10 MG PO TABS: 10.0000 mg | ORAL_TABLET | Freq: Two times a day (BID) | ORAL | 0 refills | Status: AC

## 2019-04-18 MED ORDER — ATORVASTATIN CALCIUM 40 MG PO TABS: 40.0000 mg | ORAL_TABLET | Freq: Every day | ORAL | 0 refills | Status: AC

## 2019-04-18 MED ORDER — POTASSIUM CHLORIDE CRYS ER 20 MEQ PO TBCR
40.0000 meq | EXTENDED_RELEASE_TABLET | Freq: Once | ORAL | Status: AC
Start: 2019-04-18 — End: 2019-04-18
  Administered 2019-04-18: 40 meq via ORAL
  Filled 2019-04-18: qty 2

## 2019-04-18 MED ORDER — NAPROXEN 500 MG PO TABS: ORAL_TABLET | ORAL | 0 refills | Status: AC

## 2019-04-18 MED ORDER — POTASSIUM CHLORIDE CRYS ER 20 MEQ PO TBCR: 20.0000 meq | EXTENDED_RELEASE_TABLET | Freq: Every day | ORAL | 0 refills | Status: DC

## 2019-04-18 MED ORDER — METFORMIN HCL 850 MG PO TABS: 850.0000 mg | ORAL_TABLET | Freq: Two times a day (BID) | ORAL | 0 refills | Status: AC

## 2019-04-18 MED ORDER — AMLODIPINE BESYLATE 5 MG PO TABS: 5.0000 mg | ORAL_TABLET | Freq: Every day | ORAL | 0 refills | Status: DC

## 2019-04-18 NOTE — ED Notes (Signed)
Pt ambulatory without assistance at discharge.

## 2019-04-18 NOTE — ED Provider Notes (Signed)
[facility] DEPT Provider Note   CSN: [id number] Arrival date & time: 04/18/19  1131     History   Chief Complaint Chief Complaint  Patient presents with  . Abdominal Pain    HPI Molly Daniels is a 58 y.o. female.     Patient complains of lower abdominal pain.  She has pain with ovarian cyst.  She also is out of all her medicines because of her family doctor has closed  The history is provided by the patient. No language interpreter was used.  Abdominal Pain Pain location:  LLQ Pain quality: aching   Pain radiates to:  Does not radiate Pain severity:  Moderate Onset quality:  Sudden Timing:  Constant Progression:  Unchanged Chronicity:  Recurrent Context: not alcohol use   Relieved by:  Nothing Associated symptoms: no chest pain, no cough, no diarrhea, no fatigue and no hematuria     Past Medical History:  Diagnosis Date  . Diabetes mellitus without complication ([place])   . Hypertension   . Neuropathy     Patient Active Problem List   Diagnosis Date Noted  . Schizoaffective disorder ([facility]) 02/06/2017    History reviewed. No pertinent surgical history.   OB History   No obstetric history on file.      Home Medications    Prior to Admission medications   Medication Sig Start Date End Date Taking? Authorizing Provider  amLODipine (NORVASC) 5 MG tablet Take 1 tablet (5 mg total) by mouth daily. 10/28/16 10/28/17  Sable Feil, PA-C  amLODipine (NORVASC) 5 MG tablet Take 1 tablet (5 mg total) by mouth daily. 11/29/16 11/29/17  Sable Feil, PA-C  amLODipine (NORVASC) 5 MG tablet Take 1 tablet (5 mg total) by mouth daily. 04/18/19   Milton Ferguson, MD  atorvastatin (LIPITOR) 40 MG tablet Take 1 tablet (40 mg total) by mouth daily. 04/18/19   Milton Ferguson, MD  FLUoxetine (PROZAC) 20 MG capsule Take 1 capsule (20 mg total) by mouth daily. 06/01/18 07/01/18  Lannie Fields, PA-C  gabapentin (NEURONTIN) 300 MG capsule Take 1  capsule (300 mg total) by mouth 3 (three) times daily. 04/18/19   Milton Ferguson, MD  glipiZIDE (GLUCOTROL) 10 MG tablet Take 1 tablet (10 mg total) by mouth daily before breakfast. 03/10/19   Lawyer, Harrell Gave, PA-C  glipiZIDE (GLUCOTROL) 10 MG tablet Take 1 tablet (10 mg total) by mouth 2 (two) times daily before a meal. 04/18/19   Milton Ferguson, MD  ibuprofen (ADVIL,MOTRIN) 600 MG tablet Take 1 tablet (600 mg total) by mouth every 8 (eight) hours as needed for moderate pain. 11/18/17   Johnn Hai, PA-C  metFORMIN (GLUCOPHAGE) 850 MG tablet Take 1 tablet (850 mg total) by mouth 2 (two) times daily with a meal. 04/18/19   Milton Ferguson, MD  naproxen (NAPROSYN) 500 MG tablet Take one a day for pain 04/18/19   Milton Ferguson, MD  OLANZapine (ZYPREXA) 10 MG tablet Take 1 tablet (10 mg total) by mouth daily. 06/01/18 07/01/18  Lannie Fields, PA-C  pantoprazole (PROTONIX) 20 MG tablet Take 1 tablet (20 mg total) by mouth daily. 10/28/16   Sable Feil, PA-C  potassium chloride 20 MEQ TBCR Take 20 mEq by mouth daily. 03/10/19   Lawyer, Harrell Gave, PA-C  potassium chloride SA (K-DUR) 20 MEQ tablet Take 1 tablet (20 mEq total) by mouth daily. 04/18/19   Milton Ferguson, MD  traMADol (ULTRAM) 50 MG tablet Take 1 tablet (50 mg total) by  mouth every 6 (six) hours as needed for severe pain. 03/10/19   Lawyer, Cristal Deerhristopher, PA-C  traMADol (ULTRAM) 50 MG tablet Take 1 tablet (50 mg total) by mouth every 6 (six) hours as needed. 04/18/19   Bethann BerkshireZammit, Vickey Boak, MD  triamterene-hydrochlorothiazide (MAXZIDE-25) 37.5-25 MG tablet Take 1 tablet by mouth daily. 10/28/16 10/28/17  Joni ReiningSmith, Ronald K, PA-C  triamterene-hydrochlorothiazide (MAXZIDE-25) 37.5-25 MG tablet Take 1 tablet by mouth daily. 11/29/16 11/29/17  Joni ReiningSmith, Ronald K, PA-C    Family History No family history on file.  Social History Social History   Tobacco Use  . Smoking status: Current Every Day Smoker    Packs/day: 0.25  . Smokeless tobacco: Never Used   Substance Use Topics  . Alcohol use: Yes    Alcohol/week: 2.0 standard drinks    Types: 2 Glasses of wine per week  . Drug use: No     Allergies   Abilify [aripiprazole] and Other   Review of Systems Review of Systems  Constitutional: Negative for appetite change and fatigue.  HENT: Negative for congestion, ear discharge and sinus pressure.   Eyes: Negative for discharge.  Respiratory: Negative for cough.   Cardiovascular: Negative for chest pain.  Gastrointestinal: Positive for abdominal pain. Negative for diarrhea.  Genitourinary: Negative for frequency and hematuria.  Musculoskeletal: Negative for back pain.  Skin: Negative for rash.  Neurological: Negative for seizures and headaches.  Psychiatric/Behavioral: Negative for hallucinations.     Physical Exam Updated Vital Signs BP (!) 150/95 (BP Location: Left Arm)   Pulse 83   Temp 98.3 F (36.8 C) (Oral)   Resp 16   SpO2 99%   Physical Exam Vitals signs and nursing note reviewed.  Constitutional:      Appearance: She is well-developed.  HENT:     Head: Normocephalic.  Eyes:     General: No scleral icterus.    Conjunctiva/sclera: Conjunctivae normal.  Neck:     Musculoskeletal: Neck supple.     Thyroid: No thyromegaly.  Cardiovascular:     Rate and Rhythm: Normal rate and regular rhythm.     Heart sounds: No murmur. No friction rub. No gallop.   Pulmonary:     Breath sounds: No stridor. No wheezing or rales.  Chest:     Chest wall: No tenderness.  Abdominal:     General: There is no distension.     Tenderness: There is no abdominal tenderness. There is no rebound.  Musculoskeletal: Normal range of motion.  Lymphadenopathy:     Cervical: No cervical adenopathy.  Skin:    Findings: No erythema or rash.  Neurological:     Mental Status: She is oriented to person, place, and time.     Motor: No abnormal muscle tone.     Coordination: Coordination normal.  Psychiatric:        Behavior: Behavior  normal.      ED Treatments / Results  Labs (all labs ordered are listed, but only abnormal results are displayed) Labs Reviewed  COMPREHENSIVE METABOLIC PANEL - Abnormal; Notable for the following components:      Result Value   Potassium 3.1 (*)    Glucose, Bld 203 (*)    Total Protein 8.2 (*)    Alkaline Phosphatase 156 (*)    All other components within normal limits  CBC WITH DIFFERENTIAL/PLATELET  LIPASE, BLOOD    EKG    Radiology No results found.  Procedures Procedures (including critical care time)  Medications Ordered in ED Medications  potassium chloride  SA (K-DUR) CR tablet 40 mEq (has no administration in time range)     Initial Impression / Assessment and Plan / ED Course  I have reviewed the triage vital signs and the nursing notes.  Pertinent labs & imaging results that were available during my care of the patient were reviewed by me and considered in my medical decision making (see chart for details).       Labs all unremarkable.  Pain has improved.  Patient has ovarian cyst that hurts occasionally.  Patient will be [facility] and is given prescriptions for the medicine she has run out of  Final Clinical Impressions(s) / ED Diagnoses   Final diagnoses:  Lower abdominal pain    ED Discharge Orders         Ordered    glipiZIDE (GLUCOTROL) 10 MG tablet  2 times daily before meals     04/18/19 1449    naproxen (NAPROSYN) 500 MG tablet     04/18/19 1449    amLODipine (NORVASC) 5 MG tablet  Daily     04/18/19 1449    atorvastatin (LIPITOR) 40 MG tablet  Daily     04/18/19 1449    potassium chloride SA (K-DUR) 20 MEQ tablet  Daily     04/18/19 1449    metFORMIN (GLUCOPHAGE) 850 MG tablet  2 times daily with meals     04/18/19 1449    gabapentin (NEURONTIN) 300 MG capsule  3 times daily     04/18/19 1449    traMADol (ULTRAM) 50 MG tablet  Every 6 hours PRN     04/18/19 1449           Bethann BerkshireZammit, Reverie Vaquera, MD 04/18/19  1454

## 2019-04-18 NOTE — Discharge Instructions (Signed)
Get your family doctor.  Follow-up at [facility] for your pain and you have been referred to behavioral health for any psychiatric problems.  You have prescriptions for the medicine she wanted the tramadol has been sent electronically

## 2019-04-18 NOTE — ED Triage Notes (Signed)
Patient C/o left intermitten abdomen pain x4 days.  No pain in triage.    Patient has been seen before and was told she had a cyst in ovary.   Patient states she vomited once 2 days ago (patient states it was hot in her house)   Denies diarrhea.   Patient states she is in need for medication refill.   A/ox4 Ambulatory in triage.

## 2019-05-11 ENCOUNTER — Encounter (HOSPITAL_COMMUNITY): Payer: Self-pay | Admitting: *Deleted

## 2019-05-11 ENCOUNTER — Other Ambulatory Visit: Payer: Self-pay

## 2019-05-11 ENCOUNTER — Emergency Department (HOSPITAL_COMMUNITY)
Admission: EM | Admit: 2019-05-11 | Discharge: 2019-05-11 | Disposition: A | Discharge status: Home / Self Care | Payer: Medicare HMO | Attending: Emergency Medicine | Admitting: Emergency Medicine

## 2019-05-11 DIAGNOSIS — Z760 Encounter for issue of repeat prescription: Secondary | ICD-10-CM | POA: Diagnosis not present

## 2019-05-11 DIAGNOSIS — I10 Essential (primary) hypertension: Secondary | ICD-10-CM | POA: Insufficient documentation

## 2019-05-11 DIAGNOSIS — Y33XXXA Other specified events, undetermined intent, initial encounter: Secondary | ICD-10-CM | POA: Insufficient documentation

## 2019-05-11 DIAGNOSIS — E119 Type 2 diabetes mellitus without complications: Secondary | ICD-10-CM | POA: Diagnosis not present

## 2019-05-11 DIAGNOSIS — S20419A Abrasion of unspecified back wall of thorax, initial encounter: Secondary | ICD-10-CM | POA: Diagnosis not present

## 2019-05-11 DIAGNOSIS — L03312 Cellulitis of back [any part except buttock]: Secondary | ICD-10-CM | POA: Diagnosis not present

## 2019-05-11 DIAGNOSIS — Y929 Unspecified place or not applicable: Secondary | ICD-10-CM | POA: Diagnosis not present

## 2019-05-11 DIAGNOSIS — F17200 Nicotine dependence, unspecified, uncomplicated: Secondary | ICD-10-CM | POA: Insufficient documentation

## 2019-05-11 DIAGNOSIS — Y939 Activity, unspecified: Secondary | ICD-10-CM | POA: Diagnosis not present

## 2019-05-11 DIAGNOSIS — Z7984 Long term (current) use of oral hypoglycemic drugs: Secondary | ICD-10-CM | POA: Insufficient documentation

## 2019-05-11 DIAGNOSIS — Y999 Unspecified external cause status: Secondary | ICD-10-CM | POA: Insufficient documentation

## 2019-05-11 DIAGNOSIS — S299XXA Unspecified injury of thorax, initial encounter: Secondary | ICD-10-CM | POA: Diagnosis present

## 2019-05-11 DIAGNOSIS — Z79899 Other long term (current) drug therapy: Secondary | ICD-10-CM | POA: Diagnosis not present

## 2019-05-11 MED ORDER — POTASSIUM CHLORIDE CRYS ER 20 MEQ PO TBCR: 20.0000 meq | EXTENDED_RELEASE_TABLET | Freq: Every day | ORAL | 0 refills | Status: AC

## 2019-05-11 MED ORDER — AMLODIPINE BESYLATE 5 MG PO TABS: 5.0000 mg | ORAL_TABLET | Freq: Every day | ORAL | 0 refills | Status: AC

## 2019-05-11 MED ORDER — CEPHALEXIN 500 MG PO CAPS: 500.0000 mg | ORAL_CAPSULE | Freq: Four times a day (QID) | ORAL | 0 refills | Status: AC

## 2019-05-11 NOTE — ED Notes (Signed)
Bed: WTR7 Expected date:  Expected time:  Means of arrival:  Comments: 

## 2019-05-11 NOTE — ED Triage Notes (Signed)
Pt complains of sore on her back for the past 3 weeks and sore on her right leg x 10 years. Pt would also like refill of amlodipine, tramadol and potassium pills.

## 2019-05-11 NOTE — ED Provider Notes (Signed)
[facility]-EMERGENCY DEPT Provider Note   CSN: [id number] Arrival date & time: 05/11/19  1054   History   Chief Complaint Chief Complaint  Patient presents with   Sore   Medication Refill    HPI Molly Daniels is a 58 y.o. female with past medical history significant for diabetes, hypertension, neuropathy, schizoaffective disorder who presents for evaluation of medical refill and sore.  Patient states she has not established primary care and needs a refill of her blood pressure medicine.  Patient states that she also has a sore to her back that she would like checked out.  No fever, chills, nausea, vomiting, chest pain, shortness of breath, abdominal pain, diarrhea, dysuria, headache, weakness, slurred speech.  Has not take anything for symptoms.  History obtained from patient and past medical records.  No interpreter was used.     HPI  Past Medical History:  Diagnosis Date   Diabetes mellitus without complication ([facility])    Hypertension    Neuropathy     Patient Active Problem List   Diagnosis Date Noted   Schizoaffective disorder ([facility]) 02/06/2017    History reviewed. No pertinent surgical history.   OB History   No obstetric history on file.      Home Medications    Prior to Admission medications   Medication Sig Start Date End Date Taking? Authorizing Provider  amLODipine (NORVASC) 5 MG tablet Take 1 tablet (5 mg total) by mouth daily. 05/11/19 05/10/20  Benjamyn Hestand A, PA-C  atorvastatin (LIPITOR) 40 MG tablet Take 1 tablet (40 mg total) by mouth daily. 04/18/19   Bethann BerkshireZammit, Joseph, MD  cephALEXin (KEFLEX) 500 MG capsule Take 1 capsule (500 mg total) by mouth 4 (four) times daily. 05/11/19   Shina Wass A, PA-C  FLUoxetine (PROZAC) 20 MG capsule Take 1 capsule (20 mg total) by mouth daily. 06/01/18 07/01/18  Orvil FeilWoods, Jaclyn M, PA-C  gabapentin (NEURONTIN) 300 MG capsule Take 1 capsule (300 mg total) by mouth 3 (three) times daily. 04/18/19    Bethann BerkshireZammit, Joseph, MD  glipiZIDE (GLUCOTROL) 10 MG tablet Take 1 tablet (10 mg total) by mouth daily before breakfast. 03/10/19   Lawyer, Cristal Deerhristopher, PA-C  glipiZIDE (GLUCOTROL) 10 MG tablet Take 1 tablet (10 mg total) by mouth 2 (two) times daily before a meal. 04/18/19   Bethann BerkshireZammit, Joseph, MD  ibuprofen (ADVIL,MOTRIN) 600 MG tablet Take 1 tablet (600 mg total) by mouth every 8 (eight) hours as needed for moderate pain. 11/18/17   Tommi RumpsSummers, Rhonda L, PA-C  metFORMIN (GLUCOPHAGE) 850 MG tablet Take 1 tablet (850 mg total) by mouth 2 (two) times daily with a meal. 04/18/19   Bethann BerkshireZammit, Joseph, MD  naproxen (NAPROSYN) 500 MG tablet Take one a day for pain 04/18/19   Bethann BerkshireZammit, Joseph, MD  OLANZapine (ZYPREXA) 10 MG tablet Take 1 tablet (10 mg total) by mouth daily. 06/01/18 07/01/18  Orvil FeilWoods, Jaclyn M, PA-C  pantoprazole (PROTONIX) 20 MG tablet Take 1 tablet (20 mg total) by mouth daily. 10/28/16   Joni ReiningSmith, Ronald K, PA-C  potassium chloride 20 MEQ TBCR Take 20 mEq by mouth daily. 03/10/19   Lawyer, Cristal Deerhristopher, PA-C  potassium chloride SA (K-DUR) 20 MEQ tablet Take 1 tablet (20 mEq total) by mouth daily. 05/11/19   Emilyrose Darrah A, PA-C  traMADol (ULTRAM) 50 MG tablet Take 1 tablet (50 mg total) by mouth every 6 (six) hours as needed for severe pain. 03/10/19   Lawyer, Cristal Deerhristopher, PA-C  traMADol (ULTRAM) 50 MG tablet Take 1  tablet (50 mg total) by mouth every 6 (six) hours as needed. 04/18/19   Milton Ferguson, MD  traMADol (ULTRAM) 50 MG tablet Take 1 tablet (50 mg total) by mouth every 6 (six) hours as needed. 04/18/19   Milton Ferguson, MD  triamterene-hydrochlorothiazide (MAXZIDE-25) 37.5-25 MG tablet Take 1 tablet by mouth daily. 10/28/16 10/28/17  Sable Feil, PA-C  triamterene-hydrochlorothiazide (MAXZIDE-25) 37.5-25 MG tablet Take 1 tablet by mouth daily. 11/29/16 11/29/17  Sable Feil, PA-C    Family History No family history on file.  Social History Social History   Tobacco Use   Smoking status: Current  Every Day Smoker    Packs/day: 0.25   Smokeless tobacco: Never Used  Substance Use Topics   Alcohol use: Yes    Alcohol/week: 2.0 standard drinks    Types: 2 Glasses of wine per week   Drug use: No     Allergies   Abilify [aripiprazole] and Other   Review of Systems Review of Systems  Constitutional: Negative.   HENT: Negative.   Eyes: Negative.   Respiratory: Negative.   Cardiovascular: Negative.   Gastrointestinal: Negative.   Genitourinary: Negative.   Musculoskeletal: Negative.   Skin: Positive for wound. Negative for rash.  Neurological: Negative.   All other systems reviewed and are negative.    Physical Exam Updated Vital Signs BP (!) 144/89 (BP Location: Right Arm)    Pulse 98    Temp 98.7 F (37.1 C) (Oral)    Resp 18    SpO2 99%   Physical Exam Vitals signs and nursing note reviewed.  Constitutional:      General: She is not in acute distress.    Appearance: She is well-developed. She is not ill-appearing, toxic-appearing or diaphoretic.  HENT:     Head: Normocephalic and atraumatic.     Nose: Nose normal.     Mouth/Throat:     Mouth: Mucous membranes are moist.     Pharynx: Oropharynx is clear.  Eyes:     Pupils: Pupils are equal, round, and reactive to light.  Neck:     Musculoskeletal: Normal range of motion and neck supple.  Cardiovascular:     Rate and Rhythm: Normal rate.     Pulses: Normal pulses.     Heart sounds: Normal heart sounds. No murmur. No friction rub. No gallop.   Pulmonary:     Effort: Pulmonary effort is normal. No respiratory distress.     Breath sounds: Normal breath sounds. No stridor. No wheezing, rhonchi or rales.  Abdominal:     General: Bowel sounds are normal. There is no distension.     Tenderness: There is no abdominal tenderness. There is no guarding or rebound.     Hernia: No hernia is present.  Musculoskeletal: Normal range of motion.     Comments: Moves all 4 extremities without difficulty.  No lower  extremity edema, erythema, ecchymosis or warmth.  Skin:    General: Skin is warm and dry.     Comments: 3 mm nondraining, nonbloody abrasion with mild 1.5 cm area of erythema.  No fluctuance or induration.  No Streaking.  Neurological:     Mental Status: She is alert.     Comments: Cranial nerves II through XII grossly intact.  No facial droop.  No dysphasia.  Ambulatory in ED without difficulty.      ED Treatments / Results  Labs (all labs ordered are listed, but only abnormal results are displayed) Labs Reviewed - No data to  display  EKG None  Radiology No results found.  Procedures Procedures (including critical care time)  Medications Ordered in ED Medications - No data to display   Initial Impression / Assessment and Plan / ED Course  I have reviewed the triage vital signs and the nursing notes.  Pertinent labs & imaging results that were available during my care of the patient were reviewed by me and considered in my medical decision making (see chart for details).  58 year old female appears otherwise well presents for evaluation of medication refill and sore.  She is afebrile, nonseptic, non-ill-appearing.  Patient does not have PCP follow-up and needs her blood pressure medicine and potassium refilled.  She had labs drawn approximately 3 weeks ago when she was here for abdominal pain.  Patient did have hypokalemia at that time to 3.1, previous labs at 2.7.  She is asymptomatic.  I have low suspicion for hypertensive urgency or emergency.  She is not taking her medicines over the last 2 days.  Heart and lungs clear.  Abdomen soft, nontender without rebound or guarding.  Normal neurologic exam without neurologic deficits.  Ambulatory in ED without difficulty.  She also has nondraining, nonbloody abrasion with mild surrounding erythema to her lateral mid back.  No streaking.  She has no systemic symptoms to suggest Sirs or sepsis.  No induration or fluctuance to suggest  abscess.  Likely abrasion with mild surrounding cellulitis.  Will DC home on Keflex.  Suggest wound check in 2 days.  We will also refill her medicines.  Discussed PCP follow-up for continued medication management.  The patient has been appropriately medically screened and/or stabilized in the ED. I have low suspicion for any other emergent medical condition which would require further screening, evaluation or treatment in the ED or require inpatient management.  Patient is hemodynamically stable and in no acute distress.  Patient able to ambulate in department prior to ED.  Evaluation does not show acute pathology that would require ongoing or additional emergent interventions while in the emergency department or further inpatient treatment.  I have discussed the diagnosis with the patient and answered all questions.  Patient has no further complaints prior to discharge.  Patient is comfortable with plan discussed in room and is stable for discharge at this time.  I have discussed strict return precautions for returning to the emergency department.  Patient was encouraged to follow-up with PCP/specialist refer to at discharge.     Final Clinical Impressions(s) / ED Diagnoses   Final diagnoses:  Medication refill  Cellulitis of back except buttock  Essential hypertension    ED Discharge Orders         Ordered    amLODipine (NORVASC) 5 MG tablet  Daily     05/11/19 1126    potassium chloride SA (K-DUR) 20 MEQ tablet  Daily     05/11/19 1126    cephALEXin (KEFLEX) 500 MG capsule  4 times daily     05/11/19 1126           Kingslee Dowse A, PA-C 05/11/19 1139    Mancel BaleWentz, Elliott, MD 05/11/19 1805

## 2019-05-11 NOTE — Discharge Instructions (Signed)
Take medicines as prescribed.  Follow-up with PCP for your future medication refills.  Follow-up in 2 days for wound check to your back.

## 2019-06-13 IMAGING — US US  BREAST BX W/ LOC DEV 1ST LESION IMG BX SPEC US GUIDE*R*
1 series · 13 of 13 positions shown · non-contrast
Comparison: Previous exam(s).

ADDENDUM:
Pathology of the RIGHT breast biopsy revealed A. BREAST, RIGHT
[DATE], 8 CM FN; ULTRASOUND GUIDED BIOPSY: FIBROADENOMATOID CHANGE
AND SCLEROSING ADENOSIS. NEGATIVE FOR ATYPIA AND MALIGNANCY. This
was found to be concordant by Dr. Unejs.

Recommendations: Six month follow-up diagnostic mammogram of the
LEFT breast for a probably benign finding recommended by Dr.
Mirela Boris.
At the patient's request, results and recommendations were relayed
to the patient by phone. She stated she has done well following the
biopsy with no bleeding, bruising, or hematoma. Post biopsy
instructions were reviewed with the patient. All of her questions
were answered. She was encouraged to call the [HOSPITAL]
with any further questions or concerns.
Results and recommendations relayed by Ying Cracker, U-Shau on 05/15/17.
CLINICAL DATA: 55-year-old female for ultrasound-guided biopsy of
an indeterminate right breast mass
EXAM:
ULTRASOUND GUIDED RIGHT BREAST CORE NEEDLE BIOPSY

[Series 1: us breast bx w/ loc dev 1st lesion img bx spec us  · 0.07mm/px · 13 of 13 slices shown]
[im 1/13]
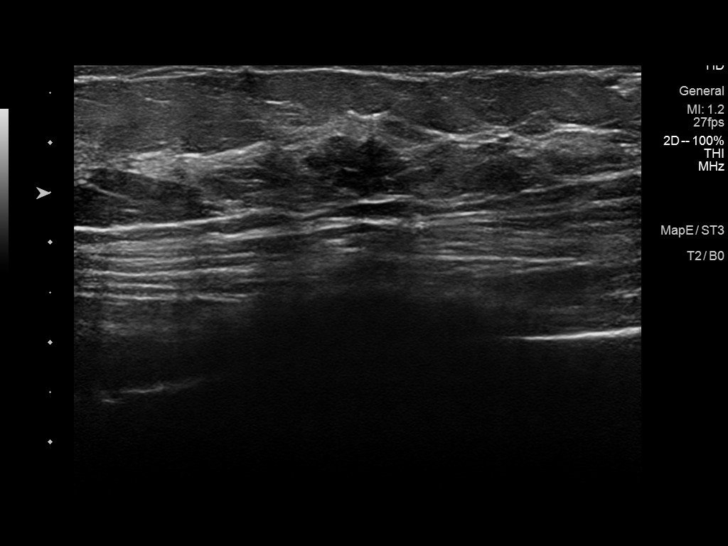
[im 2/13]
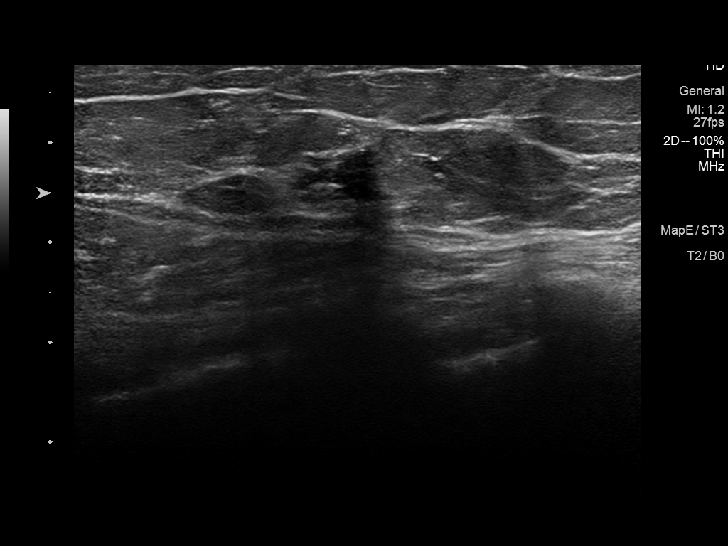
[im 3/13]
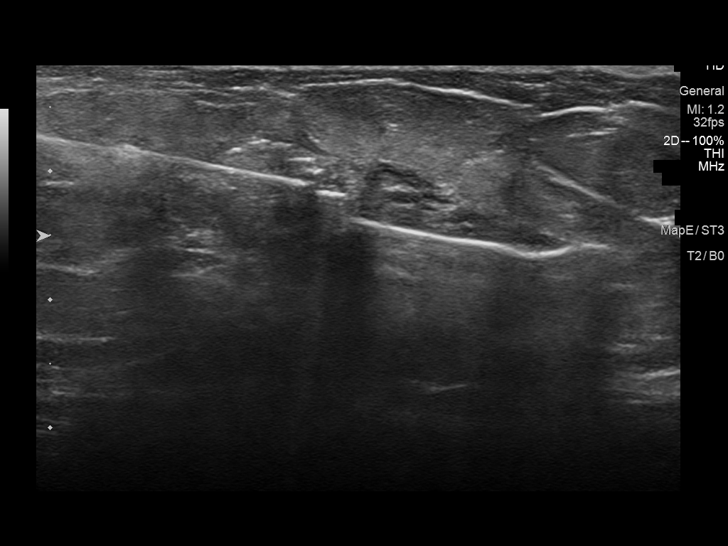
[im 4/13]
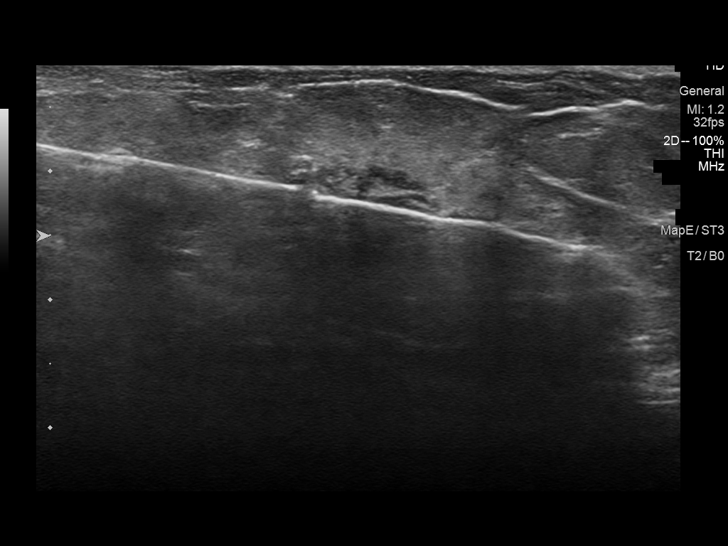
[im 5/13]
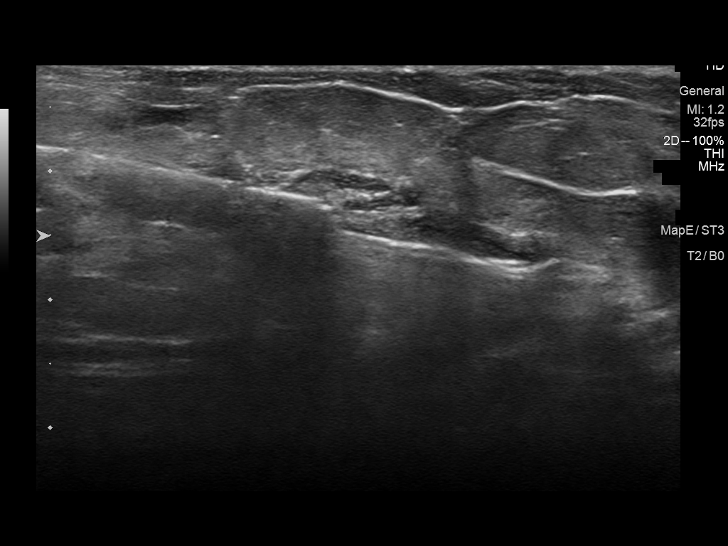
[im 6/13]
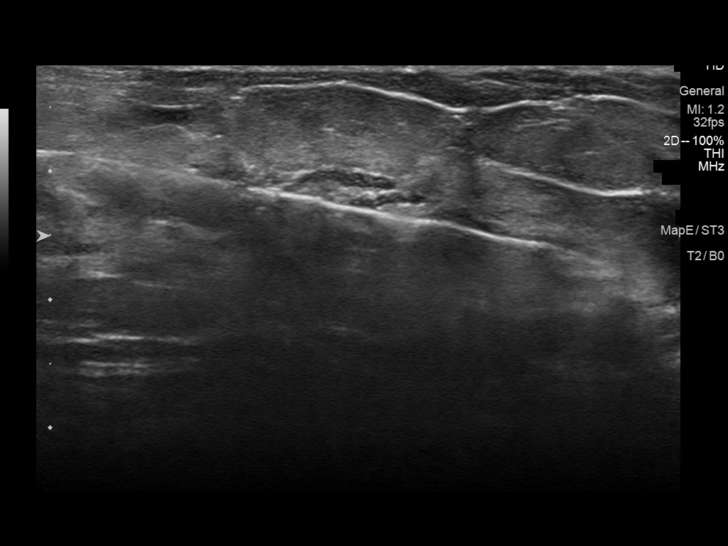
[im 7/13]
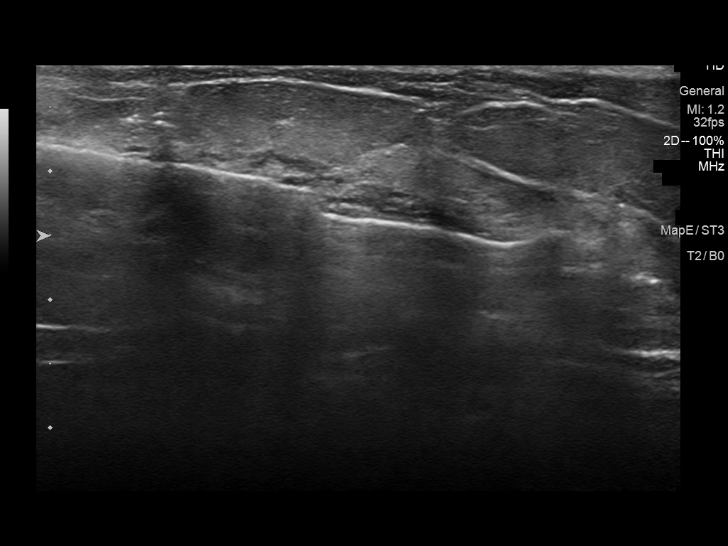
[im 8/13]
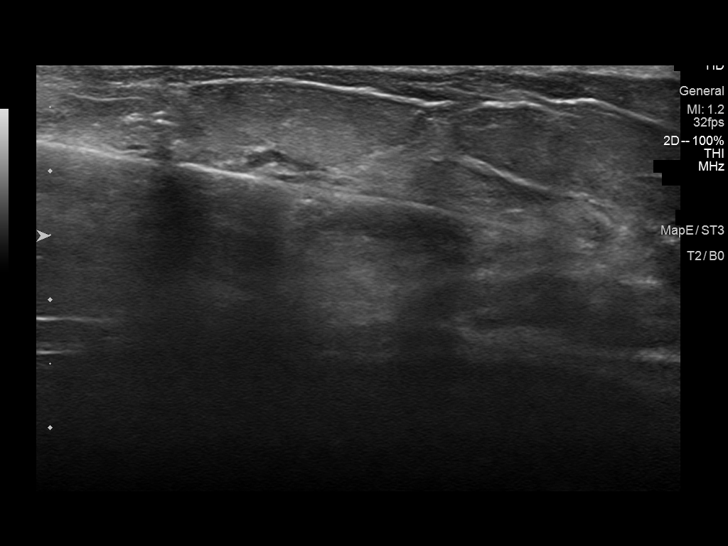
[im 9/13]
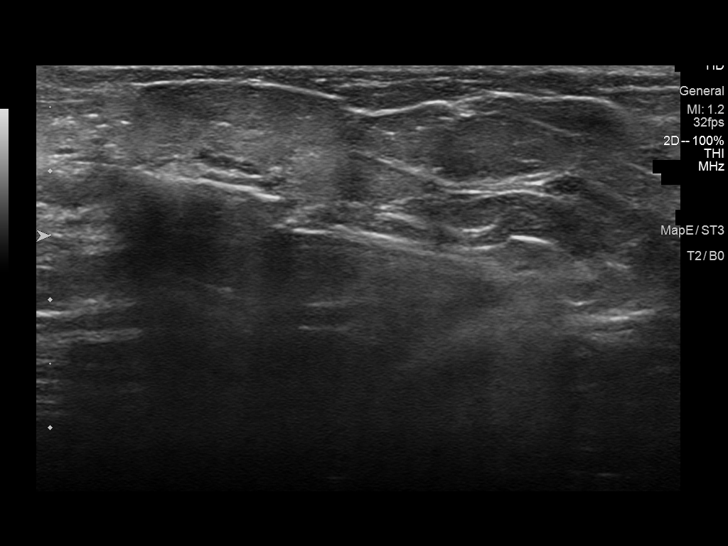
[im 10/13]
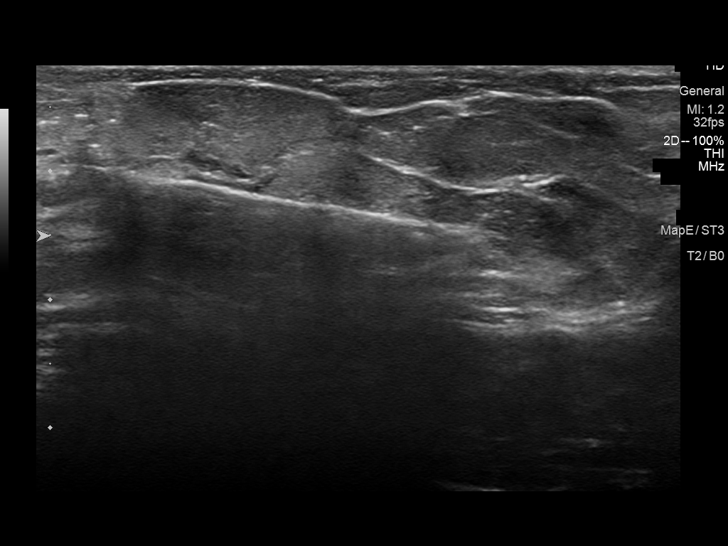
[im 11/13]
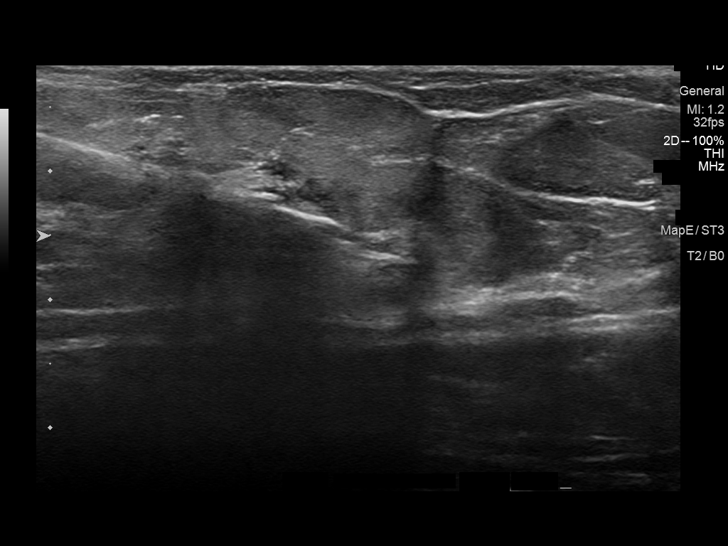
[im 12/13]
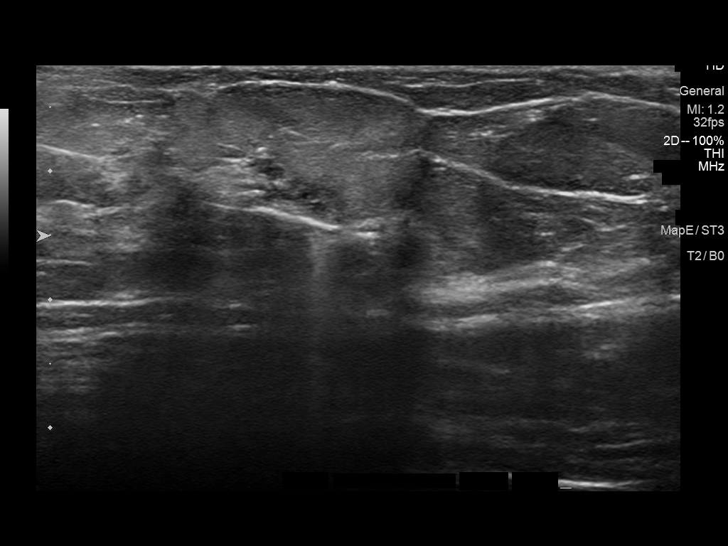
[im 13/13]
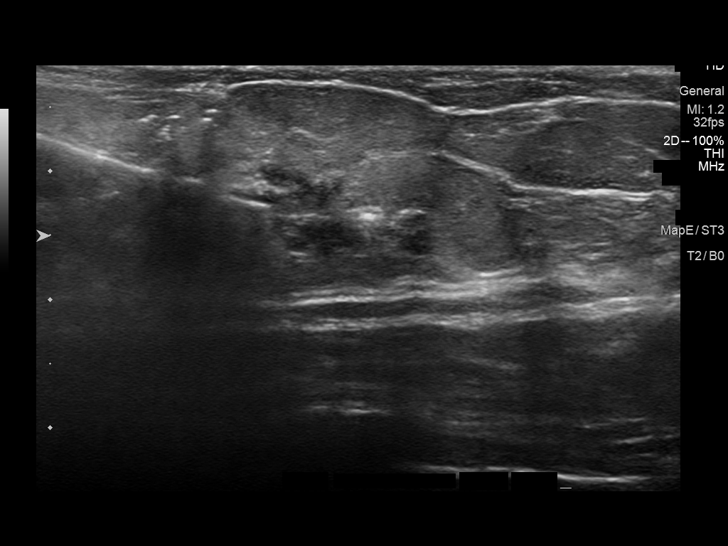

[13 of 13 positions shown; findings below may reference images not displayed]



Lesion quadrant: Upper inner quadrant

Using sterile technique and 1% Lidocaine as local anesthetic, under
direct ultrasound visualization, a 12 gauge Joan Albert device was
used to perform biopsy of an indeterminate mass in the superior
right breast using a lateral to medial approach. At the conclusion
of the procedure a coil shaped tissue marker clip was deployed into
the biopsy cavity. Follow up 2 view mammogram was performed and
dictated separately.
IMPRESSION: Ultrasound guided biopsy of an indeterminate right breast mass. No
apparent complications.

## 2019-09-17 ENCOUNTER — Emergency Department (HOSPITAL_COMMUNITY)
Admission: EM | Admit: 2019-09-17 | Discharge: 2019-09-17 | Disposition: A | Discharge status: Home / Self Care | Payer: Medicare HMO | Attending: Emergency Medicine | Admitting: Emergency Medicine

## 2019-09-17 ENCOUNTER — Encounter (HOSPITAL_COMMUNITY): Payer: Self-pay

## 2019-09-17 ENCOUNTER — Other Ambulatory Visit: Payer: Self-pay

## 2019-09-17 DIAGNOSIS — Z79899 Other long term (current) drug therapy: Secondary | ICD-10-CM | POA: Insufficient documentation

## 2019-09-17 DIAGNOSIS — Z7984 Long term (current) use of oral hypoglycemic drugs: Secondary | ICD-10-CM | POA: Diagnosis not present

## 2019-09-17 DIAGNOSIS — E119 Type 2 diabetes mellitus without complications: Secondary | ICD-10-CM | POA: Diagnosis not present

## 2019-09-17 DIAGNOSIS — M25562 Pain in left knee: Secondary | ICD-10-CM | POA: Diagnosis not present

## 2019-09-17 DIAGNOSIS — F17210 Nicotine dependence, cigarettes, uncomplicated: Secondary | ICD-10-CM | POA: Insufficient documentation

## 2019-09-17 DIAGNOSIS — Z760 Encounter for issue of repeat prescription: Secondary | ICD-10-CM

## 2019-09-17 DIAGNOSIS — I10 Essential (primary) hypertension: Secondary | ICD-10-CM | POA: Diagnosis not present

## 2019-09-17 MED ORDER — TRAMADOL HCL 50 MG PO TABS: 50.0000 mg | ORAL_TABLET | Freq: Four times a day (QID) | ORAL | 0 refills | Status: AC | PRN

## 2019-09-17 NOTE — ED Provider Notes (Signed)
[facility] DEPT Provider Note   CSN: [id number] Arrival date & time: 09/17/19  1713     History   Chief Complaint Chief Complaint  Patient presents with   Knee Pain    HPI Molly Daniels is a 58 y.o. female.     58 year old female with prior medical history as detailed below presents for evaluation and request refill on tramadol. Patient reports that she routinely takes tramadol as a as needed medication for treatment of her arthritis pain. She reports that her regular care provider last prescribed tramadol in September. She has run out. She was unable to contact her regular care provider for refill.  She is otherwise without complaint  The history is provided by the patient and medical records.  Knee Pain Location:  Leg Leg location:  L leg Pain details:    Quality:  Aching   Severity:  Mild   Onset quality:  Unable to specify   Duration:  6 months   Timing:  Constant   Progression:  Waxing and waning Chronicity:  Chronic Dislocation: no     Past Medical History:  Diagnosis Date   Diabetes mellitus without complication ([place])    Hypertension    Neuropathy     Patient Active Problem List   Diagnosis Date Noted   Schizoaffective disorder ([place]) 02/06/2017    History reviewed. No pertinent surgical history.   OB History   No obstetric history on file.      Home Medications    Prior to Admission medications   Medication Sig Start Date End Date Taking? Authorizing Provider  amLODipine (NORVASC) 5 MG tablet Take 1 tablet (5 mg total) by mouth daily. 05/11/19 05/10/20  Henderly, Britni A, PA-C  atorvastatin (LIPITOR) 40 MG tablet Take 1 tablet (40 mg total) by mouth daily. 04/18/19   Milton Ferguson, MD  cephALEXin (KEFLEX) 500 MG capsule Take 1 capsule (500 mg total) by mouth 4 (four) times daily. 05/11/19   Henderly, Britni A, PA-C  FLUoxetine (PROZAC) 20 MG capsule Take 1 capsule (20 mg total) by mouth daily. 06/01/18 07/01/18   Lannie Fields, PA-C  gabapentin (NEURONTIN) 300 MG capsule Take 1 capsule (300 mg total) by mouth 3 (three) times daily. 04/18/19   Milton Ferguson, MD  glipiZIDE (GLUCOTROL) 10 MG tablet Take 1 tablet (10 mg total) by mouth daily before breakfast. 03/10/19   Lawyer, Harrell Gave, PA-C  glipiZIDE (GLUCOTROL) 10 MG tablet Take 1 tablet (10 mg total) by mouth 2 (two) times daily before a meal. 04/18/19   Milton Ferguson, MD  ibuprofen (ADVIL,MOTRIN) 600 MG tablet Take 1 tablet (600 mg total) by mouth every 8 (eight) hours as needed for moderate pain. 11/18/17   Johnn Hai, PA-C  metFORMIN (GLUCOPHAGE) 850 MG tablet Take 1 tablet (850 mg total) by mouth 2 (two) times daily with a meal. 04/18/19   Milton Ferguson, MD  naproxen (NAPROSYN) 500 MG tablet Take one a day for pain 04/18/19   Milton Ferguson, MD  OLANZapine (ZYPREXA) 10 MG tablet Take 1 tablet (10 mg total) by mouth daily. 06/01/18 07/01/18  Lannie Fields, PA-C  pantoprazole (PROTONIX) 20 MG tablet Take 1 tablet (20 mg total) by mouth daily. 10/28/16   Sable Feil, PA-C  potassium chloride 20 MEQ TBCR Take 20 mEq by mouth daily. 03/10/19   Lawyer, Harrell Gave, PA-C  potassium chloride SA (K-DUR) 20 MEQ tablet Take 1 tablet (20 mEq total) by mouth daily. 05/11/19   Henderly, Britni  A, PA-C  traMADol (ULTRAM) 50 MG tablet Take 1 tablet (50 mg total) by mouth every 6 (six) hours as needed for severe pain. 03/10/19   Lawyer, Cristal Deerhristopher, PA-C  traMADol (ULTRAM) 50 MG tablet Take 1 tablet (50 mg total) by mouth every 6 (six) hours as needed. 04/18/19   Bethann BerkshireZammit, Joseph, MD  traMADol (ULTRAM) 50 MG tablet Take 1 tablet (50 mg total) by mouth every 6 (six) hours as needed. 04/18/19   Bethann BerkshireZammit, Joseph, MD  traMADol (ULTRAM) 50 MG tablet Take 1 tablet (50 mg total) by mouth every 6 (six) hours as needed. 09/17/19   Wynetta FinesMessick, Alyxandra Tenbrink C, MD  triamterene-hydrochlorothiazide (MAXZIDE-25) 37.5-25 MG tablet Take 1 tablet by mouth daily. 10/28/16 10/28/17  Joni ReiningSmith, Ronald K,  PA-C  triamterene-hydrochlorothiazide (MAXZIDE-25) 37.5-25 MG tablet Take 1 tablet by mouth daily. 11/29/16 11/29/17  Joni ReiningSmith, Ronald K, PA-C    Family History History reviewed. No pertinent family history.  Social History Social History   Tobacco Use   Smoking status: Current Every Day Smoker    Packs/day: 0.25   Smokeless tobacco: Never Used  Substance Use Topics   Alcohol use: Yes    Alcohol/week: 2.0 standard drinks    Types: 2 Glasses of wine per week   Drug use: No     Allergies   Abilify [aripiprazole] and Other   Review of Systems Review of Systems  All other systems reviewed and are negative.    Physical Exam Updated Vital Signs BP (!) 153/99 (BP Location: Right Arm)    Pulse 93    Temp 98.6 F (37 C) (Oral)    Resp 16    Ht 5\' 4"  (1.626 m)    Wt 86 kg    SpO2 99%    BMI 32.54 kg/m   Physical Exam Vitals signs and nursing note reviewed.  Constitutional:      General: She is not in acute distress.    Appearance: She is well-developed.  HENT:     Head: Normocephalic and atraumatic.  Eyes:     Conjunctiva/sclera: Conjunctivae normal.     Pupils: Pupils are equal, round, and reactive to light.  Neck:     Musculoskeletal: Normal range of motion and neck supple.  Cardiovascular:     Rate and Rhythm: Normal rate and regular rhythm.     Heart sounds: Normal heart sounds.  Pulmonary:     Effort: Pulmonary effort is normal. No respiratory distress.     Breath sounds: Normal breath sounds.  Abdominal:     General: There is no distension.     Palpations: Abdomen is soft.     Tenderness: There is no abdominal tenderness.  Musculoskeletal: Normal range of motion.        General: No deformity.  Skin:    General: Skin is warm and dry.  Neurological:     General: No focal deficit present.     Mental Status: She is alert and oriented to person, place, and time. Mental status is at baseline.      ED Treatments / Results  Labs (all labs ordered are  listed, but only abnormal results are displayed) Labs Reviewed - No data to display  EKG None  Radiology No results found.  Procedures Procedures (including critical care time)  Medications Ordered in ED Medications - No data to display   Initial Impression / Assessment and Plan / ED Course  I have reviewed the triage vital signs and the nursing notes.  Pertinent labs & imaging  results that were available during my care of the patient were reviewed by me and considered in my medical decision making (see chart for details).        MDM  Screen complete  Molly Daniels was evaluated in Emergency Department on 09/17/2019 for the symptoms described in the history of present illness. She was evaluated in the context of the global COVID-19 pandemic, which necessitated consideration that the patient might be at risk for infection with the SARS-CoV-2 virus that causes COVID-19. Institutional protocols and algorithms that pertain to the evaluation of patients at risk for COVID-19 are in a state of rapid change based on information released by regulatory bodies including the CDC and federal and state organizations. These policies and algorithms were followed during the patient's care in the ED.  Patient is presenting for refill on tramadol.  She is otherwise without complaint.  Patient is advised to follow-up closely with a regular care provider on Monday for refill. Small supply of tramadol prescribed pending her follow up with her PCP.  Importance of close follow-up stressed. Strict return precautions given and understood.   Final Clinical Impressions(s) / ED Diagnoses   Final diagnoses:  Medication refill    ED Discharge Orders         Ordered    traMADol (ULTRAM) 50 MG tablet  Every 6 hours PRN     09/17/19 1935           Wynetta Fines, MD 09/17/19 1940

## 2019-09-17 NOTE — ED Triage Notes (Signed)
PT DENIES INJURY. LEFT KNEE PAIN. NEEDS TORADOL RX. PCP DID NOT REFILL THIS MONTH. PCP DID FILL LAST MONTH.    PT IS REQUEST NO VISITORS

## 2019-09-17 NOTE — Discharge Instructions (Addendum)
Please return for any problem. Follow up with your regular care provider as instructed for refill of her medications.
